# Patient Record
Sex: Male | Born: 1950 | Race: White | Hispanic: No | Marital: Married | Smoking: Never smoker
Health system: Southern US, Community
[De-identification: ages and names within clinical notes are randomized; demographics above are authoritative.]

## PROBLEM LIST (undated history)

## (undated) DIAGNOSIS — R011 Cardiac murmur, unspecified: Secondary | ICD-10-CM

## (undated) DIAGNOSIS — E119 Type 2 diabetes mellitus without complications: Secondary | ICD-10-CM

## (undated) DIAGNOSIS — I10 Essential (primary) hypertension: Secondary | ICD-10-CM

## (undated) HISTORY — PX: LAPAROSCOPIC GASTRIC BANDING: SHX1100

## (undated) HISTORY — DX: Type 2 diabetes mellitus without complications: E11.9

## (undated) HISTORY — PX: TONSILLECTOMY: SUR1361

## (undated) HISTORY — DX: Essential (primary) hypertension: I10

## (undated) HISTORY — DX: Cardiac murmur, unspecified: R01.1

---

## 2007-11-23 ENCOUNTER — Encounter: Admission: RE | Admit: 2007-11-23 | Discharge: 2007-11-23 | Payer: Self-pay | Admitting: Surgery

## 2007-11-23 ENCOUNTER — Ambulatory Visit (HOSPITAL_COMMUNITY): Admission: RE | Admit: 2007-11-23 | Discharge: 2007-11-23 | Payer: Self-pay | Admitting: Surgery

## 2007-12-12 ENCOUNTER — Ambulatory Visit (HOSPITAL_COMMUNITY): Admission: RE | Admit: 2007-12-12 | Discharge: 2007-12-12 | Payer: Self-pay | Admitting: Surgery

## 2008-02-01 ENCOUNTER — Ambulatory Visit: Payer: Self-pay | Admitting: Vascular Surgery

## 2008-04-09 ENCOUNTER — Encounter: Admission: RE | Admit: 2008-04-09 | Discharge: 2008-05-29 | Payer: Self-pay | Admitting: Surgery

## 2008-04-23 ENCOUNTER — Ambulatory Visit (HOSPITAL_COMMUNITY): Admission: RE | Admit: 2008-04-23 | Discharge: 2008-04-24 | Payer: Self-pay | Admitting: Surgery

## 2008-04-27 ENCOUNTER — Emergency Department (HOSPITAL_COMMUNITY): Admission: EM | Admit: 2008-04-27 | Discharge: 2008-04-27 | Payer: Self-pay | Admitting: Emergency Medicine

## 2008-07-17 ENCOUNTER — Ambulatory Visit: Payer: Self-pay | Admitting: Vascular Surgery

## 2008-08-15 ENCOUNTER — Encounter: Admission: RE | Admit: 2008-08-15 | Discharge: 2008-08-15 | Payer: Self-pay | Admitting: Vascular Surgery

## 2008-08-15 ENCOUNTER — Ambulatory Visit: Payer: Self-pay | Admitting: Vascular Surgery

## 2011-01-20 NOTE — Op Note (Signed)
Chris Walls, Chris Walls              ACCOUNT NO.:  [id number]   MEDICAL RECORD NO.:  [id number]          PATIENT TYPE:  OIB   LOCATION:  1535                         FACILITY:  [facility]   PHYSICIAN:  Thornton Park. Daphine Deutscher, MD  DATE OF BIRTH:  1951/07/29   DATE OF PROCEDURE:  04/23/2008  DATE OF DISCHARGE:                               OPERATIVE REPORT   PREOPERATIVE DIAGNOSES:  Morbid obesity with diabetes mellitus type 2,  body mass index 37 and hypertension.   POSTOPERATIVE DIAGNOSES:  Morbid obesity with diabetes mellitus type 2,  body mass index 37 and hypertension.   PROCEDURE:  Laparoscopic adjustable band, Allergan APL.   SURGEON:  Thornton Park. Daphine Deutscher, MD.   ASSISTANT:  Alfonse Ras, MD.   ANESTHESIA:  General endotracheal.   DESCRIPTION OF PROCEDURE:  Chris Walls was taken to room #1on Monday,  April 23, 2008, and given general anesthesia.  I clipped him around his  patches of psoriasis and then prepped his abdomen with Techni-Care and  draped him sterilely.  Access of the abdomen was gained through the left  upper quadrant using Optiview without difficulty.  The abdomen was  insufflated and the standard trocar placements were used, putting a 15  in the right upper position and a 12 down lower just above the  umbilicus.  He was very long waisted, it was quite a ways up to his EG  junction.  I went ahead and put a Satira Mccallum in through a 5 in the upper  midline.  Another 12 was placed to the left of the umbilicus for the  angle scope.   First, I went over and dissected the stomach on the left crus.  I then  went along to the right crus and dissected the area just above the fat  stripe.   Next, we passed a sizing tubing and blew up the balloon and pulled it  back.  It did not demonstrate a hiatal hernia.  Because of the angle and  trajectory, I did not feel like I would get a good angle from a lower  port, so I went ahead introduced an APL band into the abdomen.  I then  passed a band passer through the upper port on the right side.  This  came around through an open area that was readily seen and the band  passer presented itself easily.  The APL band was threaded through this  and brought around and engaged through the buckle.  It was held in  position in loose fashion until the sizing balloon could be passed in  the stomach and then it was buckled done over that.  There was plenty of  room.  The band had 5 mL of saline in it.  I went ahead and left that in  there as I plicated the stomach up proximally making a fairly small  pouch proximally.  Three sutures were placed and secured with tie knots.  No bleeding was encountered.  The tubing was then brought out through  the lower port on the right which was then extended and opened onto the  fascia.  It was connected to the port and then secured with four sutures  of 2-0 Prolene to the fascia.  The wound was irrigated and then closed  with 4-0 Vicryl subcutaneously with staples because of his  psoriasis.  The staples will be removed early.  All port sites were  injected with Marcaine, as well as were closed 4-0 Vicryls prior to  placement of the staples.  The patient tolerated the procedure well and  was taken to the recovery room in satisfactory condition.      Thornton Park Daphine Deutscher, MD  Electronically Signed     MBM/MEDQ  D:  04/23/2008  T:  04/23/2008  Job:  [id number]   cc:   Jacqualine Code, MD  Fax: [phone]

## 2011-01-20 NOTE — Consult Note (Signed)
NAMEMARKIES, MOWATT NO.:  [id number]   MEDICAL RECORD NO.:  [id number]          PATIENT TYPE:  EMS   LOCATION:  ED                           FACILITY:  [facility]   PHYSICIAN:  Thornton Park. Daphine Deutscher, MD  DATE OF BIRTH:  [birth year]   DATE OF CONSULTATION:  04/27/2008  DATE OF DISCHARGE:                                 CONSULTATION   .   CHIEF COMPLAINT:  Nausea with, of late, retching.   HISTORY:  Chris Walls is a 60 year old white male who is 4 days post  lap band APL at [facility].  He presents with a 16-hour history of  nausea and dry heaves.  He spoke with Dr. Derrell Lolling earlier in the evening,  who ordered some Phenergan suppositories.  I called him at home and his  pulse rate was in the mid 70s according to his wife, who checked it.  I  went ahead and asked him to come to the emergency room and I would see  him.  I saw him in room 4.  He was sitting up and looked somewhat  nauseated and uncomfortable.  Before his lab can be drawn, the x-ray  dept called for him and so met him up over there.  I went ahead and got  a lap band needle, and I removed 4.5 cc of fluid from his port.  Subsequently he had an upper GI by Dr. Verdene Lennert with first  Gastrografin and then light barium, which showed no leak or  extravasation.  There was no evidence of obstruction and contrast flowed  into the stomach.  He did have a fair amount of gas, which I think he  has been doing some air swallowing.   He feels much better.  His pulse rate after the procedure was 80.  His  lab has not been drawn and we will go ahead and get a CBC and a BMET to  check those.  If those are unremarkable, I think we will let him go home  on clear liquids and will follow it up as needed over the weekend.  He  is supposed to come back on Monday for staple removal since he has  psoriasis and I have closed his skin with staples.   IMPRESSION:  Status post lap band, upper gastrointestinal appears  unremarkable.  Awaiting laboratory work and final disposition.      Thornton Park Daphine Deutscher, MD  Electronically Signed     MBM/MEDQ  D:  04/27/2008  T:  04/27/2008  Job:  [id number]

## 2011-01-20 NOTE — Procedures (Signed)
DUPLEX ULTRASOUND OF ABDOMINAL AORTA   INDICATION:  Follow up abdominal aortic aneurysm.   HISTORY:  Diabetes:  No.  Cardiac:  Heart murmur.  Hypertension:  Yes.  Smoking:  No.  Connective Tissue Disorder:  Family History:  No.  Previous Surgery:  Laparoscopic banding done earlier this year but after  the last ultrasound done on 11/23/2007.   DUPLEX EXAM:         AP (cm)                   TRANSVERSE (cm)  Proximal             2.9 Cm                    2.8 cm  Mid                  Not visualized            Not visualized  Distal               2.1 cm                    1.9 cm  Right Iliac          1.2 cm                    1.3 cm  Left Iliac           1.2 cm                    1.3 cm   PREVIOUS:  Date: 11/23/2007  AP:  4.2  TRANSVERSE:  3.3 (Performed at  [facility]).   IMPRESSION:  1. No evidence of abdominal aortic aneurysm is noted, based on limited      visualization as a result from extensive bowel gas patterns.  2. The previous ultrasound performed at [facility] noted a      juxtarenal abdominal aortic aneurysm that could not be appreciated      during this examination.   ___________________________________________  Janetta Hora Fields, MD   CH/MEDQ  D:  07/17/2008  T:  07/17/2008  Job:  [id number]

## 2011-01-20 NOTE — Assessment & Plan Note (Signed)
OFFICE VISIT   Chris Walls, Chris Walls  DOB:  Apr 22, 1951                                       02/01/2008  TIRWE#:[id number]   The patient is a 60 year old male referred by Dr. Daphine Deutscher for evaluation  of abdominal aortic aneurysm.  He was recently seen by Dr. Daphine Deutscher for  consideration for laparoscopic banding procedure for obesity.  At the  time of workup for this, he had an ultrasound of the abdomen which  showed a 4.2 cm abdominal aortic aneurysm.  He has had no abdominal pain  or back pain.  He has no significant family history of aneurysm.  His  primary atherosclerotic risk factors include hypertension.  He also has  a history of diabetes.  He denies history of coronary artery disease or  elevated cholesterol.   PAST SURGICAL HISTORY:  Unremarkable.   Medications include metformin 1000 mg twice a day, Hyzaar 100/25 once a  day.  Glipizide 10 mg once a day, Actos 30 mg once a day, multivitamin.   He is allergic to penicillin which causes swelling and a rash.   SOCIAL HISTORY:  He is married and has two children.  Currently works as  a Administrator.  He is a nonsmoker non-consumer of alcohol.   REVIEW OF SYSTEMS:  He is 5 feet 8 inches, 245 pounds.  CARDIAC:  Has a history of murmur in the past.  Pulmonary, GI, renal vascular, neurologic, psychiatric, ENT, hematologic  review of systems are all negative.  SKIN:  He has had a rash which is currently being evaluated by  dermatologist and thought to be probably psoriasis.   PHYSICAL EXAM:  Blood pressure is 171/104 in the left arm, heart rate is  64 and regular.  HEENT is unremarkable.  Neck has 2+ carotid pulses  without bruit.  Chest:  Clear to auscultation.  Cardiac exam is regular  rate and rhythm.  Abdomen is obese, soft, nontender, nondistended with  no masses.  Extremities:  He has 2+ radial and femoral pulses  bilaterally.  He has a 3+ right popliteal pulse and a 2+ left popliteal  pulse.  He has 2+  dorsalis pedis and posterior tibial pulses  bilaterally.  Feet are warm, pink and well-perfused.  He has trace edema  in both lower extremities from the knee down.   Ultrasound dated November 23, 2007 from [facility] is reviewed.  This showed a 3 cm in diameter.  This also showed diffuse fatty  infiltration of the liver.   In light of his full-feeling popliteal pulse on the right side, we also  did a popliteal ultrasound on him today which showed normal popliteal  arteries bilaterally.   In summary, the patient has a 4.2 cm abdominal aortic aneurysm which is  currently asymptomatic.  I had a lengthy discussion with the patient  today about the pathophysiology of his abdominal aortic aneurysms.  I  informed him that the risk of rupture for aneurysm less than 5-1/2 cm in  diameter is less than 1% per year.  However, I also told him that  aneurysms of that size, eventually 75% of them become large enough that  they require repair.  I went over the symptoms of ruptured aneurysm,  which is back pain and abdominal pain, and told him that if he has  either of these, he should  go to the emergency room and let someone know  that he has an abdominal aortic aneurysm.  Otherwise, I believe the best  option for him would be continued ultrasound surveillance.  We will have  a repeat ultrasound of his aneurysm in six months' time.  I continue 6  months ultrasounds to assess the aneurysm for growth over time.  Do not  believe he has any contraindication to laparoscopic banding as far as  his aneurysm is concerned.   Chris Hora. Fields, MD  Electronically Signed   CEF/MEDQ  D:  02/01/2008  T:  02/02/2008  Job:  [id number]   cc:   Thornton Park Daphine Deutscher, MD  Jacqualine Code, MD

## 2011-01-20 NOTE — Procedures (Signed)
VASCULAR LAB EXAM   INDICATION:  Rule out popliteal artery aneurysm bilaterally.   HISTORY:  Diabetes:  Yes.  Cardiac:  No.  Hypertension:  Yes.   EXAM:  The patient has a known 4.2 cm abdominal aortic aneurysm and  prominent bilateral popliteal artery pulses.   IMPRESSION:  1. Right popliteal artery proximal 0.81 cm AP, 1.0 cm transverse.  Mid      0.63 cm AP, 0.7 cm transverse.  Distal 0.68 cm AP, 0.66 cm      transverse.  Left popliteal artery proximal 0.72 cm AP, 0.61 cm      transverse.  Mid 0.63 cm AP, 0.58 cm transverse.  Distal 0.64 cm      AP, 0.70 cm transverse.  2. No evidence of significant popliteal artery dilation bilaterally.   ___________________________________________  Janetta Hora Fields, MD   MC/MEDQ  D:  02/01/2008  T:  02/01/2008  Job:  [id number]

## 2011-06-05 LAB — BASIC METABOLIC PANEL
Calcium: 9.8
GFR calc Af Amer: >60
GFR calc non Af Amer: >60
Potassium: 3.7
Sodium: 140

## 2011-06-05 LAB — HEMOGLOBIN AND HEMATOCRIT, BLOOD
HCT: 46.2
Hemoglobin: 15.7

## 2012-12-14 ENCOUNTER — Telehealth (INDEPENDENT_AMBULATORY_CARE_PROVIDER_SITE_OTHER): Payer: Self-pay | Admitting: Surgery

## 2012-12-14 NOTE — Telephone Encounter (Signed)
12/14/12 lm and mailed recall letter for pt to call [phone] to schedule a bariatric follow-up appt with Dr. Daphine Deutscher. Pt has not been seen since his surgery-had lap band surgery 04/23/08.ls

## 2013-06-21 ENCOUNTER — Ambulatory Visit (INDEPENDENT_AMBULATORY_CARE_PROVIDER_SITE_OTHER): Payer: 59 | Admitting: Surgery

## 2013-06-21 ENCOUNTER — Encounter (INDEPENDENT_AMBULATORY_CARE_PROVIDER_SITE_OTHER): Payer: Self-pay | Admitting: Surgery

## 2013-06-21 VITALS — BP 138/76 | HR 60 | Resp 16 | Ht 69.0 in | Wt 177.8 lb

## 2013-06-21 DIAGNOSIS — I10 Essential (primary) hypertension: Secondary | ICD-10-CM | POA: Insufficient documentation

## 2013-06-21 DIAGNOSIS — E119 Type 2 diabetes mellitus without complications: Secondary | ICD-10-CM | POA: Insufficient documentation

## 2013-06-21 DIAGNOSIS — Z9884 Bariatric surgery status: Secondary | ICD-10-CM

## 2013-06-21 NOTE — Progress Notes (Signed)
Tonette Bihari 62 y.o.  Body mass index is 26.24 kg/(m^2).  There are no active problems to display for this patient.   Allergies  Allergen Reactions  . Penicillins Anaphylaxis    Past Surgical History  Procedure Laterality Date  . Laparoscopic gastric banding    . Tonsillectomy     CABEZA,YURI, MD No diagnosis found.  Mr. Nealy is 74 lbs below his preop weight 5 years ago.  She has kept it off.  He understands and is using this tool appropriately.  He appears to be in the green zone.  Discussed his strategies to maintain weight loss. Will see him back in 1 year.   Matt B. Daphine Deutscher, MD, [facility] [phone] beeper [phone]  06/21/2013 3:47 PM

## 2013-06-21 NOTE — Patient Instructions (Signed)
Thanks for your patience.  If you need further assistance after leaving the office, please call our office and speak with a [facility] nurse.  [phone].  If you want to leave a message for Dr. Geonna Lockyer, please call his office phone at [phone]. 

## 2017-11-09 ENCOUNTER — Encounter (HOSPITAL_COMMUNITY): Payer: Self-pay

## 2019-09-28 ENCOUNTER — Ambulatory Visit: Payer: Medicare HMO | Attending: Internal Medicine

## 2019-09-28 DIAGNOSIS — Z23 Encounter for immunization: Secondary | ICD-10-CM

## 2019-09-28 NOTE — Progress Notes (Signed)
   Covid-19 Vaccination Clinic  Name:  CANDON CARAS    MRN: [id number] DOB: [birth year]  09/28/2019  Mr. Befort was observed post Covid-19 immunization for 15 minutes without incidence. He was provided with Vaccine Information Sheet and instruction to access the V-Safe system.   Mr. Joost was instructed to call 911 with any severe reactions post vaccine: Marland Kitchen Difficulty breathing  . Swelling of your face and throat  . A fast heartbeat  . A bad rash all over your body  . Dizziness and weakness    Immunizations Administered    Name Date Dose VIS Date Route   Pfizer COVID-19 Vaccine 09/28/2019  1:26 PM 0.3 mL 08/18/2019 Intramuscular   Manufacturer: [employer], [employer]   Lot: [id number]   NDC: [address]-1

## 2019-10-17 ENCOUNTER — Ambulatory Visit: Payer: Medicare HMO | Attending: Internal Medicine

## 2019-10-17 DIAGNOSIS — Z23 Encounter for immunization: Secondary | ICD-10-CM | POA: Insufficient documentation

## 2019-10-17 NOTE — Progress Notes (Signed)
   Covid-19 Vaccination Clinic  Name:  Chris Walls    MRN: [id number] DOB: Nov 28, 1950  10/17/2019  Mr. Pellow was observed post Covid-19 immunization for 15 minutes without incidence. He was provided with Vaccine Information Sheet and instruction to access the V-Safe system.   Mr. Servellon was instructed to call 911 with any severe reactions post vaccine: Marland Kitchen Difficulty breathing  . Swelling of your face and throat  . A fast heartbeat  . A bad rash all over your body  . Dizziness and weakness    Immunizations Administered    Name Date Dose VIS Date Route   Pfizer COVID-19 Vaccine 10/17/2019  8:36 AM 0.3 mL 08/18/2019 Intramuscular   Manufacturer: [employer], [employer]   Lot: [id number]   NDC: [address]-3

## 2021-01-14 ENCOUNTER — Other Ambulatory Visit: Payer: Self-pay | Admitting: Student

## 2021-01-14 DIAGNOSIS — Z9884 Bariatric surgery status: Secondary | ICD-10-CM

## 2021-02-05 ENCOUNTER — Ambulatory Visit
Admission: RE | Admit: 2021-02-05 | Discharge: 2021-02-05 | Disposition: A | Discharge status: Home / Self Care | Payer: Medicare HMO | Source: Ambulatory Visit | Attending: Student | Admitting: Student

## 2021-02-05 ENCOUNTER — Other Ambulatory Visit: Payer: Self-pay | Admitting: Student

## 2021-02-05 DIAGNOSIS — Z9884 Bariatric surgery status: Secondary | ICD-10-CM

## 2021-02-11 ENCOUNTER — Observation Stay (HOSPITAL_BASED_OUTPATIENT_CLINIC_OR_DEPARTMENT_OTHER)
Admission: EM | Admit: 2021-02-11 | Discharge: 2021-02-12 | Disposition: A | Discharge status: Home / Self Care | Payer: Medicare HMO | Attending: Internal Medicine | Admitting: Internal Medicine

## 2021-02-11 ENCOUNTER — Other Ambulatory Visit: Payer: Self-pay

## 2021-02-11 ENCOUNTER — Emergency Department (HOSPITAL_BASED_OUTPATIENT_CLINIC_OR_DEPARTMENT_OTHER): Payer: Medicare HMO

## 2021-02-11 ENCOUNTER — Encounter (HOSPITAL_BASED_OUTPATIENT_CLINIC_OR_DEPARTMENT_OTHER): Payer: Self-pay

## 2021-02-11 DIAGNOSIS — Z20822 Contact with and (suspected) exposure to covid-19: Secondary | ICD-10-CM | POA: Insufficient documentation

## 2021-02-11 DIAGNOSIS — Z7984 Long term (current) use of oral hypoglycemic drugs: Secondary | ICD-10-CM | POA: Insufficient documentation

## 2021-02-11 DIAGNOSIS — T783XXA Angioneurotic edema, initial encounter: Principal | ICD-10-CM | POA: Diagnosis present

## 2021-02-11 DIAGNOSIS — I10 Essential (primary) hypertension: Secondary | ICD-10-CM | POA: Diagnosis not present

## 2021-02-11 DIAGNOSIS — Z79899 Other long term (current) drug therapy: Secondary | ICD-10-CM | POA: Insufficient documentation

## 2021-02-11 DIAGNOSIS — R1011 Right upper quadrant pain: Secondary | ICD-10-CM

## 2021-02-11 DIAGNOSIS — R1013 Epigastric pain: Secondary | ICD-10-CM | POA: Diagnosis present

## 2021-02-11 DIAGNOSIS — E119 Type 2 diabetes mellitus without complications: Secondary | ICD-10-CM | POA: Diagnosis not present

## 2021-02-11 LAB — COMPREHENSIVE METABOLIC PANEL
ALT: 18 U/L (ref 0–44)
AST: 22 U/L (ref 15–41)
Albumin: 3.8 g/dL (ref 3.5–5.0)
Alkaline Phosphatase: 61 U/L (ref 38–126)
Anion gap: 7 (ref 5–15)
BUN: 13 mg/dL (ref 8–23)
CO2: 28 mmol/L (ref 22–32)
Calcium: 8.9 mg/dL (ref 8.9–10.3)
Chloride: 99 mmol/L (ref 98–111)
Creatinine, Ser: 1.03 mg/dL (ref 0.61–1.24)
GFR, Estimated: >60 mL/min (ref >60)
Glucose, Bld: 166 mg/dL — ABNORMAL HIGH (ref 70–99)
Potassium: 3.8 mmol/L (ref 3.5–5.1)
Sodium: 134 mmol/L — ABNORMAL LOW (ref 135–145)
Total Bilirubin: 0.7 mg/dL (ref 0.3–1.2)
Total Protein: 7.5 g/dL (ref 6.5–8.1)

## 2021-02-11 LAB — CBC
HCT: 41.5 % (ref 39.0–52.0)
Hemoglobin: 14 g/dL (ref 13.0–17.0)
MCH: 28.8 pg (ref 26.0–34.0)
MCHC: 33.7 g/dL (ref 30.0–36.0)
MCV: 85.4 fL (ref 80.0–100.0)
Platelets: 323 10*3/uL (ref 150–400)
RBC: 4.86 MIL/uL (ref 4.22–5.81)
RDW: 13.5 % (ref 11.5–15.5)
WBC: 10.7 10*3/uL — ABNORMAL HIGH (ref 4.0–10.5)
nRBC: 0 % (ref 0.0–0.2)

## 2021-02-11 LAB — URINALYSIS, ROUTINE W REFLEX MICROSCOPIC
Bilirubin Urine: NEGATIVE
Glucose, UA: NEGATIVE mg/dL
Ketones, ur: NEGATIVE mg/dL
Leukocytes,Ua: NEGATIVE
Nitrite: NEGATIVE
Protein, ur: NEGATIVE mg/dL
Specific Gravity, Urine: 1.02 (ref 1.005–1.030)
pH: 7 (ref 5.0–8.0)

## 2021-02-11 LAB — GLUCOSE, CAPILLARY: Glucose-Capillary: 127 mg/dL — ABNORMAL HIGH (ref 70–99)

## 2021-02-11 LAB — LACTIC ACID, PLASMA: Lactic Acid, Venous: 1.2 mmol/L (ref 0.5–1.9)

## 2021-02-11 LAB — RESP PANEL BY RT-PCR (FLU A&B, COVID) ARPGX2
Influenza A by PCR: NEGATIVE
Influenza B by PCR: NEGATIVE
SARS Coronavirus 2 by RT PCR: NEGATIVE

## 2021-02-11 LAB — URINALYSIS, MICROSCOPIC (REFLEX)

## 2021-02-11 LAB — LIPASE, BLOOD: Lipase: 25 U/L (ref 11–51)

## 2021-02-11 MED ORDER — ACETAMINOPHEN 650 MG RE SUPP: 650.0000 mg | Freq: Four times a day (QID) | RECTAL | Status: DC | PRN

## 2021-02-11 MED ORDER — ACETAMINOPHEN 325 MG PO TABS: 650.0000 mg | ORAL_TABLET | Freq: Four times a day (QID) | ORAL | Status: DC | PRN

## 2021-02-11 MED ORDER — SODIUM CHLORIDE 0.9 % IV BOLUS
1000.0000 mL | Freq: Once | INTRAVENOUS | Status: AC
  Administered 2021-02-11: 1000 mL via INTRAVENOUS

## 2021-02-11 MED ORDER — IOHEXOL 350 MG/ML SOLN
100.0000 mL | Freq: Once | INTRAVENOUS | Status: AC | PRN
  Administered 2021-02-11: 75 mL via INTRAVENOUS

## 2021-02-11 MED ORDER — IOHEXOL 300 MG/ML  SOLN
100.0000 mL | Freq: Once | INTRAMUSCULAR | Status: AC | PRN
  Administered 2021-02-11: 75 mL via INTRAVENOUS

## 2021-02-11 MED ORDER — ONDANSETRON HCL 4 MG/2ML IJ SOLN: 4.0000 mg | Freq: Four times a day (QID) | INTRAMUSCULAR | Status: DC | PRN

## 2021-02-11 MED ORDER — ENOXAPARIN SODIUM 40 MG/0.4ML IJ SOSY
40.0000 mg | PREFILLED_SYRINGE | Freq: Every day | INTRAMUSCULAR | Status: DC
  Administered 2021-02-11: 40 mg via SUBCUTANEOUS
  Filled 2021-02-11: qty 0.4

## 2021-02-11 MED ORDER — ONDANSETRON HCL 4 MG/2ML IJ SOLN
4.0000 mg | Freq: Once | INTRAMUSCULAR | Status: AC
  Administered 2021-02-11: 4 mg via INTRAVENOUS
  Filled 2021-02-11: qty 2

## 2021-02-11 MED ORDER — ONDANSETRON HCL 4 MG PO TABS: 4.0000 mg | ORAL_TABLET | Freq: Four times a day (QID) | ORAL | Status: DC | PRN

## 2021-02-11 MED ORDER — MORPHINE SULFATE (PF) 4 MG/ML IV SOLN
4.0000 mg | Freq: Once | INTRAVENOUS | Status: AC
  Administered 2021-02-11: 4 mg via INTRAVENOUS
  Filled 2021-02-11: qty 1

## 2021-02-11 MED ORDER — INSULIN ASPART 100 UNIT/ML IJ SOLN
0.0000 [IU] | INTRAMUSCULAR | Status: DC
  Administered 2021-02-11 – 2021-02-12 (×2): 1 [IU] via SUBCUTANEOUS

## 2021-02-11 MED ORDER — CHLORHEXIDINE GLUCONATE CLOTH 2 % EX PADS
6.0000 | MEDICATED_PAD | Freq: Every day | CUTANEOUS | Status: DC
  Administered 2021-02-11: 6 via TOPICAL

## 2021-02-11 MED ORDER — PANTOPRAZOLE SODIUM 40 MG PO TBEC
40.0000 mg | DELAYED_RELEASE_TABLET | Freq: Every day | ORAL | Status: DC
  Administered 2021-02-12: 40 mg via ORAL
  Filled 2021-02-11: qty 1

## 2021-02-11 MED ORDER — SODIUM CHLORIDE 0.9 % IV SOLN: INTRAVENOUS | Status: AC

## 2021-02-11 MED ORDER — FENTANYL CITRATE (PF) 100 MCG/2ML IJ SOLN: 25.0000 ug | INTRAMUSCULAR | Status: DC | PRN

## 2021-02-11 NOTE — ED Notes (Signed)
Patient transported to CT 

## 2021-02-11 NOTE — Plan of Care (Signed)
[facility] -> [facility]  Patient: Chris Walls (DOB 25-Aug-2051)  EDP wanting to admit for angioedema of the intestines causing severe abdominal pain in a 70 year old who takes losartans and allegedly the increase that about a month ago.He came in and like maybe a week and a half ago he had he has a lap band evaluated that day he was having some sort of indigestion or felt like he was not swallowing super well so they did a barium swallow and decided they would like to take some saline out of the band to loosen a little bit. Subsequently  he felt much better after that until this morning at 4 AM he woke up with severe almost 10 out of 10 right-sided abdominal pain. Initial EDP thogught it was an internal hernia or some sort of ischemia. CT done and Radiologist felt as if it looked like a focal angioedema of the jejunum versus gastroenteritis. 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 Nadene Rubins sill felt as if was angioedema. EDP did call GI Dr. Ewing Schlein who recommended supportive care and observation. 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 EDP also contacted General surgery and spoke to Dr. Thresa Ross who does not feel that the symptoms are related to her lap band.  The recommendation from the general surgery team was to stop the losartan and probably just watch.  GI recommended that if things were to acutely worsen and if the patient has some sort of instability lhe would probably need to go to a tertiary care center but right now he is not decompensating so they felt that patient can be managed at [facility].  Patient is having pain but labs are okay and they are recommending bowel rest and symptomatic management.  Airway angioedema and cannot get him off of that and let them get better and bowel rest and symptomatic management for now.  Patient mildly had a leukocytosis on admission but his lactic acid was normal.  He is  accepted to a SDU Observation Bed.  If necessary GI and general surgery will be able to consult but for now recommend Supportive Care.

## 2021-02-11 NOTE — ED Triage Notes (Addendum)
Pt c/o epigastric pain, n/d, chills, dizziness started ~4am-NAD-steady gait to triage

## 2021-02-11 NOTE — ED Notes (Signed)
Provided completed EKG to MD Tegeler at approximately 1200. Triage RN aware EKG completed.

## 2021-02-11 NOTE — ED Notes (Signed)
ED Provider at bedside. 

## 2021-02-11 NOTE — H&P (Signed)
History and Physical    Chris Walls WCB:[id number] DOB: 1951/06/20 DOA: 02/11/2021  PCP: Andreas Blower., MD   Patient coming from: Home   Chief Complaint: Abdominal pain, nausea, loose stools   HPI: Chris Walls is a 70 y.o. male with medical history significant for hypertension, type 2 diabetes mellitus, and laparoscopic gastric banding now presenting to the emergency department for evaluation of abdominal pain, nausea, and loose stools.  Patient reports that he was in his usual state of health yesterday but woke at 4 AM and had severe abdominal pain in the center and right of his abdomen.  There was associated nausea but no vomiting.  He had several loose stools today.  Pain continued to worsen throughout the course of the day and he presented to the emergency department around noon.  He denies any associated fevers or chills.  [facility] ED Course: Upon arrival to the ED, patient is found to be afebrile, saturating well on room air, bradycardic in the mid 40s to mid 50s, and with stable blood pressure.  EKG features bradycardia with rate 51.  Chemistry panel unremarkable and CBC with slight leukocytosis.  Lactic acid was normal.  CT imaging in the emergency department demonstrates segmental enteritis involving the jejunum suspected to reflect angioedema.  ED physician discussed the case with gastroenterology and surgery and it was recommended the patient be admitted to the hospitalist service, losartan stopped, and general supportive measures provided.  The patient was given a liter of saline, morphine, and Zofran in the ED.  Review of Systems:  All other systems reviewed and apart from HPI, are negative.  Past Medical History:  Diagnosis Date  . Diabetes mellitus without complication (HCC)   . Heart murmur   . Hypertension     Past Surgical History:  Procedure Laterality Date  . LAPAROSCOPIC GASTRIC BANDING    . TONSILLECTOMY      Social History:   reports that he has never  smoked. He has never used smokeless tobacco. He reports current alcohol use. He reports that he does not use drugs.  Allergies  Allergen Reactions  . Penicillins Anaphylaxis  . Losartan Swelling    Family History  Problem Relation Age of Onset  . Heart disease Mother   . Heart disease Father      Prior to Admission medications   Medication Sig Start Date End Date Taking? Authorizing Provider  losartan-hydrochlorothiazide [facility]) 100-12.5 MG per tablet  06/18/13   [provider]    Physical Exam: Vitals:   02/11/21 1600 02/11/21 1906 02/11/21 2000 02/11/21 2200  BP: (!) 156/77 (!) 144/74 (!) 155/71 (!) 154/56  Pulse: (!) 44 (!) 47 (!) 43 (!) 44  Resp: 14 17 17  (!) 32  Temp:    98.3 F (36.8 C)  TempSrc:    Axillary  SpO2: 100% 99% 99% 96%  Weight:    91.8 kg  Height:    5\' 9"  (1.753 m)    Constitutional: NAD, calm  Eyes: PERTLA, lids and conjunctivae normal ENMT: Mucous membranes are moist. Posterior pharynx clear of any exudate or lesions.   Neck: supple, no masses  Respiratory: no wheezing, no crackles. No accessory muscle use.  Cardiovascular: S1 & S2 heard, regular rate and rhythm. No extremity edema.  Abdomen: No distension, soft, tender in mid-abdomen without rebound pain or guarding. Bowel sounds active.  Musculoskeletal: no clubbing / cyanosis. No joint deformity upper and lower extremities.   Skin: no significant rashes, lesions, ulcers. Warm,  dry, well-perfused. Neurologic: CN 2-12 grossly intact. Sensation intact. Moving all extremities.  Psychiatric: Alert and oriented to person, place, and situation. Very pleasant and cooperative.    Labs and Imaging on Admission: I have personally reviewed following labs and imaging studies  CBC: Recent Labs  Lab 02/11/21 1225  WBC 10.7*  HGB 14.0  HCT 41.5  MCV 85.4  PLT 323   Basic Metabolic Panel: Recent Labs  Lab 02/11/21 1225  NA 134*  K 3.8  CL 99  CO2 28  GLUCOSE 166*  BUN 13   CREATININE 1.03  CALCIUM 8.9   GFR: Estimated Creatinine Clearance: 74.7 mL/min (by C-G formula based on SCr of 1.03 mg/dL). Liver Function Tests: Recent Labs  Lab 02/11/21 1225  AST 22  ALT 18  ALKPHOS 61  BILITOT 0.7  PROT 7.5  ALBUMIN 3.8   Recent Labs  Lab 02/11/21 1225  LIPASE 25   No results for input(s): AMMONIA in the last 168 hours. Coagulation Profile: No results for input(s): INR, PROTIME in the last 168 hours. Cardiac Enzymes: No results for input(s): CKTOTAL, CKMB, CKMBINDEX, TROPONINI in the last 168 hours. BNP (last 3 results) No results for input(s): PROBNP in the last 8760 hours. HbA1C: No results for input(s): HGBA1C in the last 72 hours. CBG: No results for input(s): GLUCAP in the last 168 hours. Lipid Profile: No results for input(s): CHOL, HDL, LDLCALC, TRIG, CHOLHDL, LDLDIRECT in the last 72 hours. Thyroid Function Tests: No results for input(s): TSH, T4TOTAL, FREET4, T3FREE, THYROIDAB in the last 72 hours. Anemia Panel: No results for input(s): VITAMINB12, FOLATE, FERRITIN, TIBC, IRON, RETICCTPCT in the last 72 hours. Urine analysis:    Component Value Date/Time   COLORURINE YELLOW 02/11/2021 1225   APPEARANCEUR CLEAR 02/11/2021 1225   LABSPEC 1.020 02/11/2021 1225   PHURINE 7.0 02/11/2021 1225   GLUCOSEU NEGATIVE 02/11/2021 1225   HGBUR TRACE (A) 02/11/2021 1225   BILIRUBINUR NEGATIVE 02/11/2021 1225   KETONESUR NEGATIVE 02/11/2021 1225   PROTEINUR NEGATIVE 02/11/2021 1225   NITRITE NEGATIVE 02/11/2021 1225   LEUKOCYTESUR NEGATIVE 02/11/2021 1225   Sepsis Labs: @LABRCNTIP (procalcitonin:4,lacticidven:4) ) Recent Results (from the past 240 hour(s))  Resp Panel by RT-PCR (Flu A&B, Covid) Nasopharyngeal Swab     Status: None   Collection Time: 02/11/21  4:45 PM   Specimen: Nasopharyngeal Swab; Nasopharyngeal(NP) swabs in vial transport medium  Result Value Ref Range Status   SARS Coronavirus 2 by RT PCR NEGATIVE NEGATIVE Final     Comment: (NOTE) SARS-CoV-2 target nucleic acids are NOT DETECTED.  The SARS-CoV-2 RNA is generally detectable in upper respiratory specimens during the acute phase of infection. The lowest concentration of SARS-CoV-2 viral copies this assay can detect is 138 copies/mL. A negative result does not preclude SARS-Cov-2 infection and should not be used as the sole basis for treatment or other patient management decisions. A negative result may occur with  improper specimen collection/handling, submission of specimen other than nasopharyngeal swab, presence of viral mutation(s) within the areas targeted by this assay, and inadequate number of viral copies(<138 copies/mL). A negative result must be combined with clinical observations, patient history, and epidemiological information. The expected result is Negative.  Fact Sheet for Patients:  04/13/21  Fact Sheet for Healthcare Providers:  BloggerCourse.com  This test is no t yet approved or cleared by the [place] FDA and  has been authorized for detection and/or diagnosis of SARS-CoV-2 by FDA under an Emergency Use Authorization (EUA). This EUA will remain  in effect (meaning this test can be used) for the duration of the COVID-19 declaration under Section 564(b)(1) of the Act, 21 U.S.C.section 360bbb-3(b)(1), unless the authorization is terminated  or revoked sooner.       Influenza A by PCR NEGATIVE NEGATIVE Final   Influenza B by PCR NEGATIVE NEGATIVE Final    Comment: (NOTE) The Xpert Xpress SARS-CoV-2/FLU/RSV plus assay is intended as an aid in the diagnosis of influenza from Nasopharyngeal swab specimens and should not be used as a sole basis for treatment. Nasal washings and aspirates are unacceptable for Xpert Xpress SARS-CoV-2/FLU/RSV testing.  Fact Sheet for Patients: BloggerCourse.comhttps://www.fda.gov/media/152166/download  Fact Sheet for Healthcare  Providers: SeriousBroker.ithttps://www.fda.gov/media/152162/download  This test is not yet approved or cleared by the [place] FDA and has been authorized for detection and/or diagnosis of SARS-CoV-2 by FDA under an Emergency Use Authorization (EUA). This EUA will remain in effect (meaning this test can be used) for the duration of the COVID-19 declaration under Section 564(b)(1) of the Act, 21 U.S.C. section 360bbb-3(b)(1), unless the authorization is terminated or revoked.  Performed at [facility] [place], [address] [place], [place] [id number]      Radiological Exams on Admission: CT Angio Abdomen W and/or Wo Contrast  Result Date: 02/11/2021 CLINICAL DATA:  Suspected vascular disease involving the SMA in a patient with enteritis. EXAM: CT ANGIOGRAPHY ABDOMEN TECHNIQUE: Multidetector CT imaging of the abdomen was performed using the standard protocol during bolus administration of intravenous contrast. Multiplanar reconstructed images and MIPs were obtained and reviewed to evaluate the vascular anatomy. CONTRAST:  75mL OMNIPAQUE IOHEXOL 350 MG/ML SOLN COMPARISON:  Exam from earlier on February 11, 2021. FINDINGS: VASCULAR Aorta: The aorta is of normal caliber with calcified atheromatous plaque. No signs of dissection or vasculitis. Celiac: Celiac with mild disease at the origin. Widely patent with classic celiac anatomy. No signs of aneurysmal dilation of the celiac or its branches. SMA: SMA with narrowing approximately 4 cm from the origin due to atherosclerotic plaque, mild to moderate narrowing. Vessel beyond this plaque is well opacified to peripheral branches in the abdomen. No adjacent stranding or dilation. Plaque is eccentric in the lumen along the LEFT lateral aspect of the lumen. Renals: 2 RIGHT renal arteries. Two LEFT renal arteries. Vessels are patent without signs of dilation, beading or stenosis. IMA: Patent without evidence of aneurysm, dissection, vasculitis or significant stenosis.  Inflow: Patent without evidence of aneurysm, dissection, vasculitis or significant stenosis. Veins: Veins better assessed on earlier venous phase evaluation, normal caliber and smooth contour as before. Review of the MIP images confirms the above findings. NON-VASCULAR Lower chest: Unremarkable Hepatobiliary: Normal appearance of the liver. No pericholecystic stranding. Pancreas: Normal, without mass, inflammation or ductal dilatation. Spleen: Normal spleen. Adrenals/Urinary Tract: Adrenal glands are normal. Symmetric renal enhancement without hydronephrosis. Renal sinus cysts bilaterally and small LEFT renal cyst as before. Stomach/Bowel: Lap band in place as before. Signs of segmental enteritis of the jejunum. Bowel is incompletely imaged. Findings are similar with respect to visualized portions of the bowel compared to the recent evaluation. Lymphatic: There is no gastrohepatic or hepatoduodenal ligament lymphadenopathy. No retroperitoneal or mesenteric lymphadenopathy. Other: No free air.  No portal venous gas.  No ascites. Musculoskeletal: No acute bone finding or destructive bone process. IMPRESSION: 1. 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 Vessel is well opacified to peripheral branches in the  abdomen. No adjacent stranding or dilation. 2. Aortic atherosclerosis. 3. Signs of segmental enteritis of the jejunum. Bowel is incompletely imaged. Findings are similar with respect to visualized portions of the bowel compared to the recent evaluation. Again would suggest lactate correlation and consider the possibility of angioedema. 4. Lap band in place as before. 5. Renal sinus cysts bilaterally and small LEFT renal cyst as before. Aortic Atherosclerosis (ICD10-I70.0). Electronically Signed   By: Donzetta Kohut M.D.   On: 02/11/2021 15:42   CT ABDOMEN PELVIS W  CONTRAST  Result Date: 02/11/2021 CLINICAL DATA:  Abdominal pain, post lap band. EXAM: CT ABDOMEN AND PELVIS WITH CONTRAST TECHNIQUE: Multidetector CT imaging of the abdomen and pelvis was performed using the standard protocol following bolus administration of intravenous contrast. CONTRAST:  <See Chart> OMNIPAQUE IOHEXOL 300 MG/ML  SOLN COMPARISON:  May 21, 2016.  Recent upper GI. FINDINGS: Lower chest: Incidental imaging of the lung bases is unremarkable. Distal esophagus mildly thickened as on previous imaging. Hepatobiliary: Liver with normal appearance. The no pericholecystic stranding or sign of biliary duct dilation. Pancreas: Normal, without mass, inflammation or ductal dilatation. Spleen: Spleen normal size and contour. Adrenals/Urinary Tract: Adrenal glands are normal. Symmetric renal enhancement. No hydronephrosis. Renal sinus cysts on the LEFT. Small cortical cyst at the interpolar aspect of the LEFT kidney. Renal sinus cyst also present on the RIGHT. No hydronephrosis. Normal under distended appearance of the urinary bladder. Stomach/Bowel: Post lap band as on prior studies. Angle of the lap band is unchanged compared to the previous evaluation angle at approximately 43 degrees. Moderately distended stomach. Segmental bowel wall thickening involving the jejunum with mild mesenteric inflammation/edema with gradual transition as jejunum passes into the ileum. No sign of pneumatosis. Fluid-filled loops of bowel with ingested contents in the lumen. Colon without signs of adjacent stranding. Scattered colonic diverticulosis. Vascular/Lymphatic: Aortic atherosclerosis. Atherosclerosis also affecting visceral branches. Approximately 4 cm beyond the origin of the SMA is calcified plaque which also appears to be associated with soft plaque with what appears to be at least moderate narrowing of the vessel which cannot be well quantified given motion related artifact. Vessel beyond this is grossly patent on  venous phase. No aneurysmal dilation of the abdominal aorta. SMV is patent. Portal vein is patent. There is no gastrohepatic or hepatoduodenal ligament lymphadenopathy. No retroperitoneal or mesenteric lymphadenopathy. No pelvic sidewall lymphadenopathy. Reproductive: Fiducial markers about the prostate, radiopaque metallic markers potentially related to prior prostate radiation though of uncertain significance, should be correlated with patient history. Other: Fat containing bilateral inguinal hernias. No free air. No ascites. Musculoskeletal: No acute bone finding or destructive bone process. IMPRESSION: 1. Findings of segmental enteritis involving the jejunum. Nonspecific but given angiotensin receptor blocker therapy with losartan would consider the possibility of angioedema of the small bowel. Correlate with intermittent abdominal pain, would also correlate with lactate to exclude the possibility of ischemia given atherosclerotic changes, see below. 2. SMA narrowing described above difficult to quantify given some motion and venous phase assessment. In the setting of enteritis CT angiography may be helpful for further assessment. 3. Lap band without complicating features. Distal esophageal thickening likely esophagitis. 4. Fiducial markers about the prostate gland. Correlate with any history of prostate neoplasm and prior radiation. 5. Fat containing bilateral inguinal hernias. 6. Aortic atherosclerosis. These results were called by telephone at the time of interpretation on 02/11/2021 at 1426 pm to provider [facility] , who verbally acknowledged these results. Electronically Signed   By: Donzetta Kohut  M.D.   On: 02/11/2021 14:26    EKG: Independently reviewed. Sinus bradycardia, rate 51.   Assessment/Plan   1. Abdominal pain   - Presents with acute-onset abdominal pain, nausea, and loose stools and has imaging findings suggestive of angioedema involving segment of jejunum, likely related to ARB   - Stop losartan, continue close monitoring and supportive care    2. Hypertension  - Stop losartan as above, treat as-needed only for now    3. Type II DM  - A1c was 6.2% in April 2022  - Received iodinated contrast in ED, hold metformin and use low-intensity SSI if needed for now     DVT prophylaxis: Lovenox  Code Status: Full  Level of Care: Level of care: Stepdown Family Communication: None present  Disposition Plan:  Patient is from: home  Anticipated d/c is to: Home  Anticipated d/c date is: 6/8 or 02/13/21  Patient currently: Pending continued improvement  Consults called: None  Admission status: observation     Briscoe Deutscher, MD [facility]  02/11/2021, 10:23 PM

## 2021-02-11 NOTE — ED Provider Notes (Signed)
[facility] EMERGENCY DEPARTMENT Provider Note   CSN: [id number] Arrival date & time: 02/11/21  1143     History Chief Complaint  Patient presents with  . Abdominal Pain    Chris Walls is a 70 y.o. male.  Patient reports that he woke up this morning at 4 AM with severe epigastric pain.  He reports that he has been having normal bowel movements and his last bowel movement was at 10 AM today.  He says that he has had a kidney stone in the past but this does not feel like a kidney stone.  Of note he does report that he has history of a Lap-Band which was adjusted and he had an upper endoscopy last week.  Reports nausea but no vomiting.  Denies any diarrhea.  Denies any chest pain, shortness of breath.  Denies any changes in urination.  Denies any changes in diet.  Of note, was discussing patient's antihypertensive medication regimen and he reports that approximately 1 month ago they did increase the dose of his antihypertensive medications but he is not sure how much they increased it.       Past Medical History:  Diagnosis Date  . Diabetes mellitus without complication (HCC)   . Heart murmur   . Hypertension     Patient Active Problem List   Diagnosis Date Noted  . 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 Diabetes (HCC) 06/21/2013  . Essential hypertension, benign 06/21/2013    Past Surgical History:  Procedure Laterality Date  . LAPAROSCOPIC GASTRIC BANDING    . TONSILLECTOMY         Family History  Problem Relation Age of Onset  . Heart disease Mother   . Heart disease Father     Social History   Tobacco Use  . Smoking status: Never Smoker  . Smokeless tobacco: Never Used  Substance Use Topics  . Alcohol use: Yes    Comment: occ  . Drug use: No    Home Medications Prior to Admission medications   Medication Sig Start Date End Date Taking? Authorizing Provider  losartan-hydrochlorothiazide [facility]) 100-12.5 MG per tablet  06/18/13    [provider]  metFORMIN (GLUCOPHAGE) 500 MG tablet Take 500 mg by mouth 2 (two) times daily with a meal.    [provider]    Allergies    Penicillins  Review of Systems   Review of Systems  Constitutional: Positive for chills. Negative for fever.  HENT: Negative for congestion and rhinorrhea.   Respiratory: Negative for shortness of breath.   Cardiovascular: Negative for chest pain.  Gastrointestinal: Positive for abdominal pain. Negative for constipation, diarrhea, nausea and vomiting.    Physical Exam Updated Vital Signs BP (!) 182/82 (BP Location: Right Arm)   Pulse (!) 47   Temp 98 F (36.7 C) (Oral)   Resp 18   Ht 5\' 9"  (1.753 m)   Wt 92.1 kg   SpO2 96%   BMI 29.98 kg/m   Physical Exam Vitals reviewed.  Constitutional:      General: He is in acute distress.     Appearance: He is not diaphoretic.  HENT:     Head: Normocephalic and atraumatic.     Mouth/Throat:     Mouth: Mucous membranes are moist.     Pharynx: Oropharynx is clear.  Eyes:     Extraocular Movements: Extraocular movements intact.  Cardiovascular:     Rate and Rhythm: Normal rate and regular rhythm.  Heart sounds: Normal heart sounds.  Pulmonary:     Effort: Pulmonary effort is normal. No respiratory distress.     Breath sounds: Normal breath sounds.  Abdominal:     General: A surgical scar is present. Bowel sounds are normal. There is distension.     Palpations: There is mass (Patient reports Lap-Band palpable cuff).     Tenderness: There is abdominal tenderness in the right upper quadrant, epigastric area and periumbilical area.     Hernia: A hernia is present.  Skin:    General: Skin is warm and dry.     Capillary Refill: Capillary refill takes less than 2 seconds.  Neurological:     General: No focal deficit present.     Mental Status: He is alert.  Psychiatric:        Mood and Affect: Mood normal.     ED Results / Procedures / Treatments   Labs (all labs  ordered are listed, but only abnormal results are displayed) Labs Reviewed  COMPREHENSIVE METABOLIC PANEL - Abnormal; Notable for the following components:      Result Value   Sodium 134 (*)    Glucose, Bld 166 (*)    All other components within normal limits  CBC - Abnormal; Notable for the following components:   WBC 10.7 (*)    All other components within normal limits  URINALYSIS, ROUTINE W REFLEX MICROSCOPIC - Abnormal; Notable for the following components:   Hgb urine dipstick TRACE (*)    All other components within normal limits  URINALYSIS, MICROSCOPIC (REFLEX) - Abnormal; Notable for the following components:   Bacteria, UA RARE (*)    All other components within normal limits  LIPASE, BLOOD  LACTIC ACID, PLASMA  LACTIC ACID, PLASMA    EKG EKG Interpretation  Date/Time:  Tuesday February 11 2021 11:57:03 EDT Ventricular Rate:  51 PR Interval:  130 QRS Duration: 106 QT Interval:  470 QTC Calculation: 433 R Axis:   32 Text Interpretation: Unusual P axis, possible ectopic atrial bradycardia with Premature atrial complexes 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 PT confirms WPW. NO STEMI Confirmed by Theda Belfast ([id number]) on 02/11/2021 1:26:15 PM   Radiology CT ABDOMEN PELVIS W CONTRAST  Result Date: 02/11/2021 CLINICAL DATA:  Abdominal pain, post lap band. EXAM: CT ABDOMEN AND PELVIS WITH CONTRAST TECHNIQUE: Multidetector CT imaging of the abdomen and pelvis was performed using the standard protocol following bolus administration of intravenous contrast. CONTRAST:  <See Chart> OMNIPAQUE IOHEXOL 300 MG/ML  SOLN COMPARISON:  May 21, 2016.  Recent upper GI. FINDINGS: Lower chest: Incidental imaging of the lung bases is unremarkable. Distal esophagus mildly thickened as on previous imaging. Hepatobiliary: Liver with normal appearance. The no pericholecystic stranding or sign of biliary duct dilation. Pancreas: Normal, without mass, inflammation or ductal  dilatation. Spleen: Spleen normal size and contour. Adrenals/Urinary Tract: Adrenal glands are normal. Symmetric renal enhancement. No hydronephrosis. Renal sinus cysts on the LEFT. Small cortical cyst at the interpolar aspect of the LEFT kidney. Renal sinus cyst also present on the RIGHT. No hydronephrosis. Normal under distended appearance of the urinary bladder. Stomach/Bowel: Post lap band as on prior studies. Angle of the lap band is unchanged compared to the previous evaluation angle at approximately 43 degrees. Moderately distended stomach. Segmental bowel wall thickening involving the jejunum with mild mesenteric inflammation/edema with gradual transition as jejunum passes into the ileum. No sign of pneumatosis. Fluid-filled loops of bowel with ingested contents in the lumen. Colon  without signs of adjacent stranding. Scattered colonic diverticulosis. Vascular/Lymphatic: Aortic atherosclerosis. Atherosclerosis also affecting visceral branches. Approximately 4 cm beyond the origin of the SMA is calcified plaque which also appears to be associated with soft plaque with what appears to be at least moderate narrowing of the vessel which cannot be well quantified given motion related artifact. Vessel beyond this is grossly patent on venous phase. No aneurysmal dilation of the abdominal aorta. SMV is patent. Portal vein is patent. There is no gastrohepatic or hepatoduodenal ligament lymphadenopathy. No retroperitoneal or mesenteric lymphadenopathy. No pelvic sidewall lymphadenopathy. Reproductive: Fiducial markers about the prostate, radiopaque metallic markers potentially related to prior prostate radiation though of uncertain significance, should be correlated with patient history. Other: Fat containing bilateral inguinal hernias. No free air. No ascites. Musculoskeletal: No acute bone finding or destructive bone process. IMPRESSION: 1. Findings of segmental enteritis involving the jejunum. Nonspecific but given  angiotensin receptor blocker therapy with losartan would consider the possibility of angioedema of the small bowel. Correlate with intermittent abdominal pain, would also correlate with lactate to exclude the possibility of ischemia given atherosclerotic changes, see below. 2. SMA narrowing described above difficult to quantify given some motion and venous phase assessment. In the setting of enteritis CT angiography may be helpful for further assessment. 3. Lap band without complicating features. Distal esophageal thickening likely esophagitis. 4. Fiducial markers about the prostate gland. Correlate with any history of prostate neoplasm and prior radiation. 5. Fat containing bilateral inguinal hernias. 6. Aortic atherosclerosis. These results were called by telephone at the time of interpretation on 02/11/2021 at 1426 pm to provider [facility] , who verbally acknowledged these results. Electronically Signed   By: Donzetta Kohut M.D.   On: 02/11/2021 14:26    Procedures Procedures   Medications Ordered in ED Medications  iohexol (OMNIPAQUE) 300 MG/ML solution 100 mL (75 mLs Intravenous Contrast Given 02/11/21 1329)  morphine 4 MG/ML injection 4 mg (4 mg Intravenous Given 02/11/21 1452)  sodium chloride 0.9 % bolus 1,000 mL (1,000 mLs Intravenous New Bag/Given 02/11/21 1502)  ondansetron (ZOFRAN) injection 4 mg (4 mg Intravenous Given 02/11/21 1500)  iohexol (OMNIPAQUE) 350 MG/ML injection 100 mL (75 mLs Intravenous Contrast Given 02/11/21 1510)    ED Course  I have reviewed the triage vital signs and the nursing notes.  Pertinent labs & imaging results that were available during my care of the patient were reviewed by me and considered in my medical decision making (see chart for details).    MDM Rules/Calculators/A&P                          69 year old male presents to the emergency department for abdominal pain.  He recently had manipulation of lap band approximately 2 weeks ago but woke up  this morning at approximately 4 AM with severe epigastric pain.  On arrival to the emergency department patient was hemodynamically stable.  CMP showed elevated glucose but was otherwise normal.  CBC showed mildly elevated WBC at 10.7.  Urinalysis showed trace hemoglobin but was other than was normal.  CT abdomen and pelvis was ordered and showed segmental enteritis involving the jejunum.  Nonspecific given angiotensin receptor blocker therapy with losartan would consider the possibility of angioedema of the small bowel.  It also showed SMA narrowing and recommended CT angiography.  General surgery was consulted and recommended admission for further monitoring.  Out of concern radiology reached out and requested we order further imaging with  CT angio abdomen pelvis.  We also ordered a lactic acid.  Lactic acid was 1.2.  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  The vessels well-opacified to peripheral branches in the abdomen.  No adjacent stranding or dilation.  Signs of segmental enteritis in the jejunum but the bowel is incompletely imaged.  Discussed case with gastroenterology who had no further recommendations other than that if the patient begins to decompensate may need transfer to tertiary care facility for further assessment and stabilization.  Medicine was called for the admission and the case was signed out to the oncoming provider.  Final Clinical Impression(s) / ED Diagnoses Final diagnoses:  Right upper quadrant abdominal pain    Rx / DC Orders ED Discharge Orders    None       Derrel Nipresenzo, Victor, MD 02/11/21 1554    Tegeler, Canary Brimhristopher J, MD 02/11/21 2034

## 2021-02-12 ENCOUNTER — Observation Stay (HOSPITAL_BASED_OUTPATIENT_CLINIC_OR_DEPARTMENT_OTHER): Payer: Medicare HMO

## 2021-02-12 DIAGNOSIS — T783XXA Angioneurotic edema, initial encounter: Secondary | ICD-10-CM | POA: Diagnosis not present

## 2021-02-12 DIAGNOSIS — R9431 Abnormal electrocardiogram [ECG] [EKG]: Secondary | ICD-10-CM | POA: Diagnosis not present

## 2021-02-12 LAB — ECHOCARDIOGRAM COMPLETE
AR max vel: 2.53 cm2
AV Area VTI: 2.5 cm2
AV Area mean vel: 2.36 cm2
AV Mean grad: 5 mmHg
AV Peak grad: 10 mmHg
Ao pk vel: 1.58 m/s
Area-P 1/2: 3.91 cm2
Height: 69 in
S' Lateral: 3.2 cm
Weight: 3238.12 oz

## 2021-02-12 LAB — COMPREHENSIVE METABOLIC PANEL
ALT: 14 U/L (ref 0–44)
AST: 15 U/L (ref 15–41)
Albumin: 3 g/dL — ABNORMAL LOW (ref 3.5–5.0)
Alkaline Phosphatase: 52 U/L (ref 38–126)
Anion gap: 5 (ref 5–15)
BUN: 11 mg/dL (ref 8–23)
CO2: 29 mmol/L (ref 22–32)
Calcium: 8.5 mg/dL — ABNORMAL LOW (ref 8.9–10.3)
Chloride: 105 mmol/L (ref 98–111)
Creatinine, Ser: 1.11 mg/dL (ref 0.61–1.24)
GFR, Estimated: >60 mL/min (ref >60)
Glucose, Bld: 127 mg/dL — ABNORMAL HIGH (ref 70–99)
Potassium: 3.9 mmol/L (ref 3.5–5.1)
Sodium: 139 mmol/L (ref 135–145)
Total Bilirubin: 0.9 mg/dL (ref 0.3–1.2)
Total Protein: 5.9 g/dL — ABNORMAL LOW (ref 6.5–8.1)

## 2021-02-12 LAB — HIV ANTIBODY (ROUTINE TESTING W REFLEX): HIV Screen 4th Generation wRfx: NONREACTIVE

## 2021-02-12 LAB — GLUCOSE, CAPILLARY
Glucose-Capillary: 118 mg/dL — ABNORMAL HIGH (ref 70–99)
Glucose-Capillary: 130 mg/dL — ABNORMAL HIGH (ref 70–99)
Glucose-Capillary: 107 mg/dL — ABNORMAL HIGH (ref 70–99)

## 2021-02-12 LAB — CBC
HCT: 37.3 % — ABNORMAL LOW (ref 39.0–52.0)
Hemoglobin: 12.1 g/dL — ABNORMAL LOW (ref 13.0–17.0)
MCH: 28.8 pg (ref 26.0–34.0)
MCHC: 32.4 g/dL (ref 30.0–36.0)
MCV: 88.8 fL (ref 80.0–100.0)
Platelets: 276 10*3/uL (ref 150–400)
RBC: 4.2 MIL/uL — ABNORMAL LOW (ref 4.22–5.81)
RDW: 13.8 % (ref 11.5–15.5)
WBC: 8 10*3/uL (ref 4.0–10.5)
nRBC: 0 % (ref 0.0–0.2)

## 2021-02-12 LAB — MRSA PCR SCREENING: MRSA by PCR: NEGATIVE

## 2021-02-12 MED ORDER — ORAL CARE MOUTH RINSE
15.0000 mL | Freq: Two times a day (BID) | OROMUCOSAL | Status: DC
  Administered 2021-02-12: 15 mL via OROMUCOSAL

## 2021-02-12 MED ORDER — HYDROCHLOROTHIAZIDE 12.5 MG PO CAPS: 12.5000 mg | ORAL_CAPSULE | Freq: Every day | ORAL | 1 refills | Status: AC

## 2021-02-12 MED ORDER — CHLORHEXIDINE GLUCONATE CLOTH 2 % EX PADS: 6.0000 | MEDICATED_PAD | Freq: Every day | CUTANEOUS | Status: DC

## 2021-02-12 MED ORDER — AMLODIPINE BESYLATE 10 MG PO TABS: 10.0000 mg | ORAL_TABLET | Freq: Every day | ORAL | 1 refills | Status: AC

## 2021-02-12 MED ORDER — AMLODIPINE BESYLATE 10 MG PO TABS
10.0000 mg | ORAL_TABLET | Freq: Every day | ORAL | Status: DC
  Administered 2021-02-12: 10 mg via ORAL
  Filled 2021-02-12: qty 1

## 2021-02-12 NOTE — Discharge Summary (Signed)
Physician Discharge Summary  Chris Walls ZOX:[id number] DOB: 1951/02/13 DOA: 02/11/2021  PCP: Andreas Blower., MD  Admit date: 02/11/2021 Discharge date: 02/12/2021  Admitted From: Home Disposition:  Home  Discharge Condition:Stable CODE STATUS:FULL Diet recommendation: Heart Healthy  Brief/Interim Summary:  HPI: Chris Walls is a 70 y.o. male with medical history significant for hypertension, type 2 diabetes mellitus, and laparoscopic gastric banding now presenting to the emergency department for evaluation of abdominal pain, nausea, and loose stools.  Patient reports that he was in his usual state of health yesterday but woke at 4 AM and had severe abdominal pain in the center and right of his abdomen.  There was associated nausea but no vomiting.  He had several loose stools today.  Pain continued to worsen throughout the course of the day and he presented to the emergency department around noon.  He denies any associated fevers or chills.  [facility] ED Course: Upon arrival to the ED, patient is found to be afebrile, saturating well on room air, bradycardic in the mid 40s to mid 50s, and with stable blood pressure.  EKG features bradycardia with rate 51.  Chemistry panel unremarkable and CBC with slight leukocytosis.  Lactic acid was normal.  CT imaging in the emergency department demonstrates segmental enteritis involving the jejunum suspected to reflect angioedema.  ED physician discussed the case with gastroenterology and surgery and it was recommended the patient be admitted to the hospitalist service, losartan stopped, and general supportive measures provided.  The patient was given a liter of saline, morphine, and Zofran in the ED.  Hospital course  His hospital course remained stable.  His abdomen pain, nausea, loose stools were most likely associated with bowel angioedema secondary to losartan.  This morning he does not complain of any abdominal pain, nausea, vomiting or diarrhea.  He  feels very comfortable.  He tolerated solid diet.  We stopped the losartan on discharge and replaced it with amlodipine.   he will also continue hydrochlorothiazide at home.  I have requested him to follow-up closely with his primary care physician for the management of his diabetes.   He was noticed to have sinus bradycardia.  As per the patient, this is chronic, he is fully asymptomatic.  He follows with cardiology as an outpatient.  Echocardiogram showed EF of 60-65%, mild left ventricular hypertrophy, normal left ventricular diastolic parameters, no valvular abnormalities. Patient will continue his home regimen for diabetes type 2.  He is medically stable for discharge to home today.  Discharge Diagnoses:  Principal Problem:   Angioedema Active Problems:   Diabetes (HCC)   Essential hypertension, benign    Discharge Instructions  Discharge Instructions    Diet - low sodium heart healthy   Complete by: As directed    Discharge instructions   Complete by: As directed    1)Please follow up with your PCP in a week. 2)we have changed  your  blood pressure medications.  You will need to monitor blood pressure  at home.  Follow-up with your primary care physician for  management of your HTN.Do not take losartan in the future   Increase activity slowly   Complete by: As directed      Allergies as of 02/12/2021      Reactions   Penicillins Anaphylaxis   Losartan Swelling      Medication List    STOP taking these medications   losartan-hydrochlorothiazide 100-12.5 MG tablet Commonly known as: HYZAAR     TAKE these  medications   amLODipine 10 MG tablet Commonly known as: NORVASC Take 1 tablet (10 mg total) by mouth daily.   hydrochlorothiazide 12.5 MG capsule Commonly known as: Microzide Take 1 capsule (12.5 mg total) by mouth daily.   METFORMIN HCL ER (MOD) PO Take 500 mg by mouth daily.   pantoprazole 40 MG tablet Commonly known as: PROTONIX Take 40 mg by mouth daily.        Follow-up Information    Andreas Blower., MD. Schedule an appointment as soon as possible for a visit in 1 week(s).   Specialty: Internal Medicine Contact information: [address] [place] [address] [phone]              Allergies  Allergen Reactions  . Penicillins Anaphylaxis  . Losartan Swelling    Consultations:  None   Procedures/Studies: CT Angio Abdomen W and/or Wo Contrast  Result Date: 02/11/2021 CLINICAL DATA:  Suspected vascular disease involving the SMA in a patient with enteritis. EXAM: CT ANGIOGRAPHY ABDOMEN TECHNIQUE: Multidetector CT imaging of the abdomen was performed using the standard protocol during bolus administration of intravenous contrast. Multiplanar reconstructed images and MIPs were obtained and reviewed to evaluate the vascular anatomy. CONTRAST:  75mL OMNIPAQUE IOHEXOL 350 MG/ML SOLN COMPARISON:  Exam from earlier on February 11, 2021. FINDINGS: VASCULAR Aorta: The aorta is of normal caliber with calcified atheromatous plaque. No signs of dissection or vasculitis. Celiac: Celiac with mild disease at the origin. Widely patent with classic celiac anatomy. No signs of aneurysmal dilation of the celiac or its branches. SMA: SMA with narrowing approximately 4 cm from the origin due to atherosclerotic plaque, mild to moderate narrowing. Vessel beyond this plaque is well opacified to peripheral branches in the abdomen. No adjacent stranding or dilation. Plaque is eccentric in the lumen along the LEFT lateral aspect of the lumen. Renals: 2 RIGHT renal arteries. Two LEFT renal arteries. Vessels are patent without signs of dilation, beading or stenosis. IMA: Patent without evidence of aneurysm, dissection, vasculitis or significant stenosis. Inflow: Patent without evidence of aneurysm, dissection, vasculitis or significant stenosis. Veins: Veins better assessed on earlier venous phase evaluation, normal caliber and smooth contour as before.  Review of the MIP images confirms the above findings. NON-VASCULAR Lower chest: Unremarkable Hepatobiliary: Normal appearance of the liver. No pericholecystic stranding. Pancreas: Normal, without mass, inflammation or ductal dilatation. Spleen: Normal spleen. Adrenals/Urinary Tract: Adrenal glands are normal. Symmetric renal enhancement without hydronephrosis. Renal sinus cysts bilaterally and small LEFT renal cyst as before. Stomach/Bowel: Lap band in place as before. Signs of segmental enteritis of the jejunum. Bowel is incompletely imaged. Findings are similar with respect to visualized portions of the bowel compared to the recent evaluation. Lymphatic: There is no gastrohepatic or hepatoduodenal ligament lymphadenopathy. No retroperitoneal or mesenteric lymphadenopathy. Other: No free air.  No portal venous gas.  No ascites. Musculoskeletal: No acute bone finding or destructive bone process. IMPRESSION: 1. 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 Vessel is well opacified to peripheral branches in the abdomen. No adjacent stranding or dilation. 2. Aortic atherosclerosis. 3. Signs of segmental enteritis of the jejunum. Bowel is incompletely imaged. Findings are similar with respect to visualized portions of the bowel compared to the recent evaluation. Again would suggest lactate correlation and consider the possibility of angioedema. 4. Lap band in place as before. 5. Renal sinus cysts bilaterally  and small LEFT renal cyst as before. Aortic Atherosclerosis (ICD10-I70.0). Electronically Signed   By: Donzetta Kohut M.D.   On: 02/11/2021 15:42   CT ABDOMEN PELVIS W CONTRAST  Result Date: 02/11/2021 CLINICAL DATA:  Abdominal pain, post lap band. EXAM: CT ABDOMEN AND PELVIS WITH CONTRAST TECHNIQUE: Multidetector CT imaging of the abdomen and pelvis was performed using the standard  protocol following bolus administration of intravenous contrast. CONTRAST:  <See Chart> OMNIPAQUE IOHEXOL 300 MG/ML  SOLN COMPARISON:  May 21, 2016.  Recent upper GI. FINDINGS: Lower chest: Incidental imaging of the lung bases is unremarkable. Distal esophagus mildly thickened as on previous imaging. Hepatobiliary: Liver with normal appearance. The no pericholecystic stranding or sign of biliary duct dilation. Pancreas: Normal, without mass, inflammation or ductal dilatation. Spleen: Spleen normal size and contour. Adrenals/Urinary Tract: Adrenal glands are normal. Symmetric renal enhancement. No hydronephrosis. Renal sinus cysts on the LEFT. Small cortical cyst at the interpolar aspect of the LEFT kidney. Renal sinus cyst also present on the RIGHT. No hydronephrosis. Normal under distended appearance of the urinary bladder. Stomach/Bowel: Post lap band as on prior studies. Angle of the lap band is unchanged compared to the previous evaluation angle at approximately 43 degrees. Moderately distended stomach. Segmental bowel wall thickening involving the jejunum with mild mesenteric inflammation/edema with gradual transition as jejunum passes into the ileum. No sign of pneumatosis. Fluid-filled loops of bowel with ingested contents in the lumen. Colon without signs of adjacent stranding. Scattered colonic diverticulosis. Vascular/Lymphatic: Aortic atherosclerosis. Atherosclerosis also affecting visceral branches. Approximately 4 cm beyond the origin of the SMA is calcified plaque which also appears to be associated with soft plaque with what appears to be at least moderate narrowing of the vessel which cannot be well quantified given motion related artifact. Vessel beyond this is grossly patent on venous phase. No aneurysmal dilation of the abdominal aorta. SMV is patent. Portal vein is patent. There is no gastrohepatic or hepatoduodenal ligament lymphadenopathy. No retroperitoneal or mesenteric lymphadenopathy.  No pelvic sidewall lymphadenopathy. Reproductive: Fiducial markers about the prostate, radiopaque metallic markers potentially related to prior prostate radiation though of uncertain significance, should be correlated with patient history. Other: Fat containing bilateral inguinal hernias. No free air. No ascites. Musculoskeletal: No acute bone finding or destructive bone process. IMPRESSION: 1. Findings of segmental enteritis involving the jejunum. Nonspecific but given angiotensin receptor blocker therapy with losartan would consider the possibility of angioedema of the small bowel. Correlate with intermittent abdominal pain, would also correlate with lactate to exclude the possibility of ischemia given atherosclerotic changes, see below. 2. SMA narrowing described above difficult to quantify given some motion and venous phase assessment. In the setting of enteritis CT angiography may be helpful for further assessment. 3. Lap band without complicating features. Distal esophageal thickening likely esophagitis. 4. Fiducial markers about the prostate gland. Correlate with any history of prostate neoplasm and prior radiation. 5. Fat containing bilateral inguinal hernias. 6. Aortic atherosclerosis. These results were called by telephone at the time of interpretation on 02/11/2021 at 1426 pm to provider [facility] , who verbally acknowledged these results. Electronically Signed   By: Donzetta Kohut M.D.   On: 02/11/2021 14:26   DG UGI W SINGLE CM (SOL OR THIN BA)  Result Date: 02/05/2021 CLINICAL DATA:  Status post lap band. EXAM: UPPER GI SERIES WITH KUB TECHNIQUE: After obtaining a scout radiograph a routine upper GI series was performed using thin and high density barium. FLUOROSCOPY TIME:  Fluoroscopy Time:  2  minutes and 48 seconds. Radiation Exposure Index (if provided by the fluoroscopic device): 62 mGy Number of Acquired Spot Images: COMPARISON:  None. FINDINGS: Pre-procedure KUB shows lap band in the  medial left upper quadrant oriented from 2:00 to 8:00 position. The bowel gas pattern is Frontal and lateral views of the hypopharynx while swallowing thick barium are normal. Single contrast imaging of the esophagus shows no evidence for stricture, mass lesion, diverticulum, or gross mucosal ulceration. Barium flows freely through the lap band into the stomach. Stomach is normally oriented with prompt gastric emptying. Duodenal C-loop and ligament of Treitz are normally position. Assessment of motility reveals disruption of primary peristalsis on all swallows, consistent with nonspecific esophageal motility disorder. IMPRESSION: 1. Nonspecific esophageal motility disorder. Otherwise unremarkable study in this patient status post lap band surgery. Electronically Signed   By: Kennith Center M.D.   On: 02/05/2021 08:36   ECHOCARDIOGRAM COMPLETE  Result Date: 02/12/2021    ECHOCARDIOGRAM REPORT   Patient Name:   CHANCEY RINGEL Date of Exam: 02/12/2021 Medical Rec #:  [id number]        Height:       69.0 in Accession #:    [id number]       Weight:       202.4 lb Date of Birth:  05-13-1951       BSA:          2.076 m Patient Age:    69 years         BP:           153/59 mmHg Patient Gender: M                HR:           40 bpm. Exam Location:  Inpatient Procedure: 2D Echo, Cardiac Doppler and Color Doppler Indications:    Abnormal EKG  History:        Patient has no prior history of Echocardiogram examinations.                 Risk Factors:Hypertension and Diabetes.  Sonographer:    Shirlean Kelly Referring Phys: [id number] Etienne Millward IMPRESSIONS  1. Left ventricular ejection fraction, by estimation, is 60 to 65%. The left ventricle has normal function. The left ventricle has no regional wall motion abnormalities. There is mild left ventricular hypertrophy. Left ventricular diastolic parameters were normal.  2. Right ventricular systolic function is normal. The right ventricular size is normal.  3. Left atrial  size was mildly dilated.  4. The mitral valve is normal in structure. No evidence of mitral valve regurgitation. No evidence of mitral stenosis.  5. The aortic valve is grossly normal. Aortic valve regurgitation is trivial. Mild aortic valve sclerosis is present, with no evidence of aortic valve stenosis.  6. The inferior vena cava is normal in size with greater than 50% respiratory variability, suggesting right atrial pressure of 3 mmHg. FINDINGS  Left Ventricle: Left ventricular ejection fraction, by estimation, is 60 to 65%. The left ventricle has normal function. The left ventricle has no regional wall motion abnormalities. The left ventricular internal cavity size was normal in size. There is  mild left ventricular hypertrophy. Left ventricular diastolic parameters were normal. Right Ventricle: The right ventricular size is normal. No increase in right ventricular wall thickness. Right ventricular systolic function is normal. Left Atrium: Left atrial size was mildly dilated. Right Atrium: Right atrial size was normal in size. Pericardium: There is no evidence of pericardial  effusion. Mitral Valve: The mitral valve is normal in structure. No evidence of mitral valve regurgitation. No evidence of mitral valve stenosis. Tricuspid Valve: The tricuspid valve is normal in structure. Tricuspid valve regurgitation is not demonstrated. No evidence of tricuspid stenosis. Aortic Valve: The aortic valve is grossly normal. Aortic valve regurgitation is trivial. Mild aortic valve sclerosis is present, with no evidence of aortic valve stenosis. Aortic valve mean gradient measures 5.0 mmHg. Aortic valve peak gradient measures 10.0 mmHg. Aortic valve area, by VTI measures 2.50 cm. Pulmonic Valve: The pulmonic valve was normal in structure. Pulmonic valve regurgitation is not visualized. No evidence of pulmonic stenosis. Aorta: The aortic root is normal in size and structure. Venous: The inferior vena cava is normal in size  with greater than 50% respiratory variability, suggesting right atrial pressure of 3 mmHg. IAS/Shunts: No atrial level shunt detected by color flow Doppler.  LEFT VENTRICLE PLAX 2D LVIDd:         5.10 cm  Diastology LVIDs:         3.20 cm  LV e' medial:    7.72 cm/s LV PW:         1.20 cm  LV E/e' medial:  12.8 LV IVS:        1.00 cm  LV e' lateral:   11.20 cm/s LVOT diam:     2.00 cm  LV E/e' lateral: 8.8 LV SV:         90 LV SV Index:   43 LVOT Area:     3.14 cm  RIGHT VENTRICLE             IVC RV Basal diam:  4.70 cm     IVC diam: 1.80 cm RV S prime:     13.60 cm/s TAPSE (M-mode): 2.6 cm LEFT ATRIUM             Index       RIGHT ATRIUM           Index LA diam:        4.60 cm 2.22 cm/m  RA Area:     12.50 cm LA Vol (A2C):   86.2 ml 41.51 ml/m RA Volume:   27.00 ml  13.00 ml/m LA Vol (A4C):   82.3 ml 39.64 ml/m LA Biplane Vol: 87.0 ml 41.90 ml/m  AORTIC VALVE AV Area (Vmax):    2.53 cm AV Area (Vmean):   2.36 cm AV Area (VTI):     2.50 cm AV Vmax:           158.00 cm/s AV Vmean:          102.000 cm/s AV VTI:            0.360 m AV Peak Grad:      10.0 mmHg AV Mean Grad:      5.0 mmHg LVOT Vmax:         127.00 cm/s LVOT Vmean:        76.600 cm/s LVOT VTI:          0.287 m LVOT/AV VTI ratio: 0.80  AORTA Ao Root diam: 3.00 cm Ao Asc diam:  3.40 cm MITRAL VALVE MV Area (PHT): 3.91 cm    SHUNTS MV Decel Time: 194 msec    Systemic VTI:  0.29 m MV E velocity: 99.00 cm/s  Systemic Diam: 2.00 cm MV A velocity: 84.40 cm/s MV E/A ratio:  1.17 Donato Schultz MD Electronically signed by Donato Schultz MD Signature Date/Time: 02/12/2021/2:39:36 PM  Final       Subjective:  Patient seen and examined the bedside this morning.  Hemodynamically stable for discharge today.  Discharge Exam: Vitals:   02/12/21 1308 02/12/21 1400  BP: (!) 141/65 (!) 155/51  Pulse: (!) 44 (!) 42  Resp: 12 12  Temp:    SpO2: 97% 96%   Vitals:   02/12/21 1000 02/12/21 1130 02/12/21 1308 02/12/21 1400  BP: (!) 146/55  (!) 141/65 (!)  155/51  Pulse: (!) 40  (!) 44 (!) 42  Resp: 16  12 12   Temp:  98.2 F (36.8 C)    TempSrc:  Oral    SpO2: 95%  97% 96%  Weight:      Height:        General: Pt is alert, awake, not in acute distress Cardiovascular: RRR, S1/S2 +, no rubs, no gallops Respiratory: CTA bilaterally, no wheezing, no rhonchi Abdominal: Soft, NT, ND, bowel sounds + Extremities: no edema, no cyanosis    The results of significant diagnostics from this hospitalization (including imaging, microbiology, ancillary and laboratory) are listed below for reference.     Microbiology: Recent Results (from the past 240 hour(s))  Resp Panel by RT-PCR (Flu A&B, Covid) Nasopharyngeal Swab     Status: None   Collection Time: 02/11/21  4:45 PM   Specimen: Nasopharyngeal Swab; Nasopharyngeal(NP) swabs in vial transport medium  Result Value Ref Range Status   SARS Coronavirus 2 by RT PCR NEGATIVE NEGATIVE Final    Comment: (NOTE) SARS-CoV-2 target nucleic acids are NOT DETECTED.  The SARS-CoV-2 RNA is generally detectable in upper respiratory specimens during the acute phase of infection. The lowest concentration of SARS-CoV-2 viral copies this assay can detect is 138 copies/mL. A negative result does not preclude SARS-Cov-2 infection and should not be used as the sole basis for treatment or other patient management decisions. A negative result may occur with  improper specimen collection/handling, submission of specimen other than nasopharyngeal swab, presence of viral mutation(s) within the areas targeted by this assay, and inadequate number of viral copies(<138 copies/mL). A negative result must be combined with clinical observations, patient history, and epidemiological information. The expected result is Negative.  Fact Sheet for Patients:  BloggerCourse.comhttps://www.fda.gov/media/152166/download  Fact Sheet for Healthcare Providers:  SeriousBroker.ithttps://www.fda.gov/media/152162/download  This test is no t yet approved or cleared  by the [place] FDA and  has been authorized for detection and/or diagnosis of SARS-CoV-2 by FDA under an Emergency Use Authorization (EUA). This EUA will remain  in effect (meaning this test can be used) for the duration of the COVID-19 declaration under Section 564(b)(1) of the Act, 21 U.S.C.section 360bbb-3(b)(1), unless the authorization is terminated  or revoked sooner.       Influenza A by PCR NEGATIVE NEGATIVE Final   Influenza B by PCR NEGATIVE NEGATIVE Final    Comment: (NOTE) The Xpert Xpress SARS-CoV-2/FLU/RSV plus assay is intended as an aid in the diagnosis of influenza from Nasopharyngeal swab specimens and should not be used as a sole basis for treatment. Nasal washings and aspirates are unacceptable for Xpert Xpress SARS-CoV-2/FLU/RSV testing.  Fact Sheet for Patients: BloggerCourse.comhttps://www.fda.gov/media/152166/download  Fact Sheet for Healthcare Providers: SeriousBroker.ithttps://www.fda.gov/media/152162/download  This test is not yet approved or cleared by the [place] FDA and has been authorized for detection and/or diagnosis of SARS-CoV-2 by FDA under an Emergency Use Authorization (EUA). This EUA will remain in effect (meaning this test can be used) for the duration of the COVID-19 declaration under Section 564(b)(1) of the  Act, 21 U.S.C. section 360bbb-3(b)(1), unless the authorization is terminated or revoked.  Performed at [facility], [address] [place], [place] [address]   MRSA PCR Screening     Status: None   Collection Time: 02/12/21  4:40 AM   Specimen: Nasopharyngeal  Result Value Ref Range Status   MRSA by PCR NEGATIVE NEGATIVE Final    Comment:        The GeneXpert MRSA Assay (FDA approved for NASAL specimens only), is one component of a comprehensive MRSA colonization surveillance program. It is not intended to diagnose MRSA infection nor to guide or monitor treatment for MRSA infections. Performed at [facility],  [address], [place], [place] [address]      Labs: BNP (last 3 results) No results for input(s): BNP in the last 8760 hours. Basic Metabolic Panel: Recent Labs  Lab 02/11/21 1225 02/12/21 0259  NA 134* 139  K 3.8 3.9  CL 99 105  CO2 28 29  GLUCOSE 166* 127*  BUN 13 11  CREATININE 1.03 1.11  CALCIUM 8.9 8.5*   Liver Function Tests: Recent Labs  Lab 02/11/21 1225 02/12/21 0259  AST 22 15  ALT 18 14  ALKPHOS 61 52  BILITOT 0.7 0.9  PROT 7.5 5.9*  ALBUMIN 3.8 3.0*   Recent Labs  Lab 02/11/21 1225  LIPASE 25   No results for input(s): AMMONIA in the last 168 hours. CBC: Recent Labs  Lab 02/11/21 1225 02/12/21 0259  WBC 10.7* 8.0  HGB 14.0 12.1*  HCT 41.5 37.3*  MCV 85.4 88.8  PLT 323 276   Cardiac Enzymes: No results for input(s): CKTOTAL, CKMB, CKMBINDEX, TROPONINI in the last 168 hours. BNP: Invalid input(s): POCBNP CBG: Recent Labs  Lab 02/11/21 2346 02/12/21 0334 02/12/21 0754 02/12/21 1212  GLUCAP 127* 118* 130* 107*   D-Dimer No results for input(s): DDIMER in the last 72 hours. Hgb A1c No results for input(s): HGBA1C in the last 72 hours. Lipid Profile No results for input(s): CHOL, HDL, LDLCALC, TRIG, CHOLHDL, LDLDIRECT in the last 72 hours. Thyroid function studies No results for input(s): TSH, T4TOTAL, T3FREE, THYROIDAB in the last 72 hours.  Invalid input(s): FREET3 Anemia work up No results for input(s): VITAMINB12, FOLATE, FERRITIN, TIBC, IRON, RETICCTPCT in the last 72 hours. Urinalysis    Component Value Date/Time   COLORURINE YELLOW 02/11/2021 1225   APPEARANCEUR CLEAR 02/11/2021 1225   LABSPEC 1.020 02/11/2021 1225   PHURINE 7.0 02/11/2021 1225   GLUCOSEU NEGATIVE 02/11/2021 1225   HGBUR TRACE (A) 02/11/2021 1225   BILIRUBINUR NEGATIVE 02/11/2021 1225   KETONESUR NEGATIVE 02/11/2021 1225   PROTEINUR NEGATIVE 02/11/2021 1225   NITRITE NEGATIVE 02/11/2021 1225   LEUKOCYTESUR NEGATIVE 02/11/2021 1225   Sepsis  Labs Invalid input(s): PROCALCITONIN,  WBC,  LACTICIDVEN Microbiology Recent Results (from the past 240 hour(s))  Resp Panel by RT-PCR (Flu A&B, Covid) Nasopharyngeal Swab     Status: None   Collection Time: 02/11/21  4:45 PM   Specimen: Nasopharyngeal Swab; Nasopharyngeal(NP) swabs in vial transport medium  Result Value Ref Range Status   SARS Coronavirus 2 by RT PCR NEGATIVE NEGATIVE Final    Comment: (NOTE) SARS-CoV-2 target nucleic acids are NOT DETECTED.  The SARS-CoV-2 RNA is generally detectable in upper respiratory specimens during the acute phase of infection. The lowest concentration of SARS-CoV-2 viral copies this assay can detect is 138 copies/mL. A negative result does not preclude SARS-Cov-2 infection and should not be used  as the sole basis for treatment or other patient management decisions. A negative result may occur with  improper specimen collection/handling, submission of specimen other than nasopharyngeal swab, presence of viral mutation(s) within the areas targeted by this assay, and inadequate number of viral copies(<138 copies/mL). A negative result must be combined with clinical observations, patient history, and epidemiological information. The expected result is Negative.  Fact Sheet for Patients:  BloggerCourse.com  Fact Sheet for Healthcare Providers:  SeriousBroker.it  This test is no t yet approved or cleared by the [place] FDA and  has been authorized for detection and/or diagnosis of SARS-CoV-2 by FDA under an Emergency Use Authorization (EUA). This EUA will remain  in effect (meaning this test can be used) for the duration of the COVID-19 declaration under Section 564(b)(1) of the Act, 21 U.S.C.section 360bbb-3(b)(1), unless the authorization is terminated  or revoked sooner.       Influenza A by PCR NEGATIVE NEGATIVE Final   Influenza B by PCR NEGATIVE NEGATIVE Final    Comment:  (NOTE) The Xpert Xpress SARS-CoV-2/FLU/RSV plus assay is intended as an aid in the diagnosis of influenza from Nasopharyngeal swab specimens and should not be used as a sole basis for treatment. Nasal washings and aspirates are unacceptable for Xpert Xpress SARS-CoV-2/FLU/RSV testing.  Fact Sheet for Patients: BloggerCourse.com  Fact Sheet for Healthcare Providers: SeriousBroker.it  This test is not yet approved or cleared by the [place] FDA and has been authorized for detection and/or diagnosis of SARS-CoV-2 by FDA under an Emergency Use Authorization (EUA). This EUA will remain in effect (meaning this test can be used) for the duration of the COVID-19 declaration under Section 564(b)(1) of the Act, 21 U.S.C. section 360bbb-3(b)(1), unless the authorization is terminated or revoked.  Performed at [facility], [address] [place], [place] [address]   MRSA PCR Screening     Status: None   Collection Time: 02/12/21  4:40 AM   Specimen: Nasopharyngeal  Result Value Ref Range Status   MRSA by PCR NEGATIVE NEGATIVE Final    Comment:        The GeneXpert MRSA Assay (FDA approved for NASAL specimens only), is one component of a comprehensive MRSA colonization surveillance program. It is not intended to diagnose MRSA infection nor to guide or monitor treatment for MRSA infections. Performed at [facility], [address], [place], [place] [address]     Please note: You were cared for by a hospitalist during your hospital stay. Once you are discharged, your primary care physician will handle any further medical issues. Please note that NO REFILLS for any discharge medications will be authorized once you are discharged, as it is imperative that you return to your primary care physician (or establish a relationship with a primary care physician if you do not have one) for your post hospital  discharge needs so that they can reassess your need for medications and monitor your lab values.    Time coordinating discharge: 40 minutes  SIGNED:   Burnadette Pop, MD  [facility] 02/12/2021, 2:50 PM Pager [phone]  If 7PM-7AM, please contact night-coverage [email or web address] Password [email or web address]

## 2021-02-12 NOTE — Progress Notes (Signed)
Pt discharged. PIV removed. AVS went over patient. He stated he had no further questions. Belongings were returned and he was wheeled down to the front by this RN.

## 2021-07-22 IMAGING — CT CT ABD-PELV W/ CM
2 of 5 series · 14 of 46 positions shown, 16 images · IV contrast (omnipaque)
Comparison: May 21, 2016.  Recent upper GI.

CLINICAL DATA: Abdominal pain, post lap band.

EXAM:
CT ABDOMEN AND PELVIS WITH CONTRAST
TECHNIQUE: Multidetector CT imaging of the abdomen and pelvis was performed
using the standard protocol following bolus administration of
intravenous contrast.
CONTRAST:  <See Chart> OMNIPAQUE IOHEXOL 300 MG/ML  SOLN

[Series 2: axial st · axial · 0.98mm/px · z∈[-568,+2]mm · 11 of 126 slices shown, 13 images]
[im 6/126  soft-tissue]
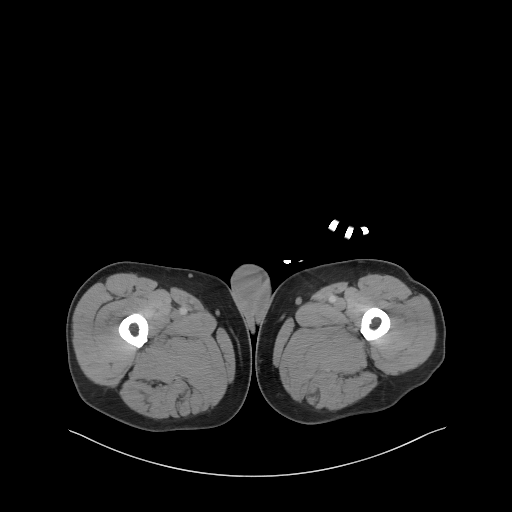
[im 6/126  bone]
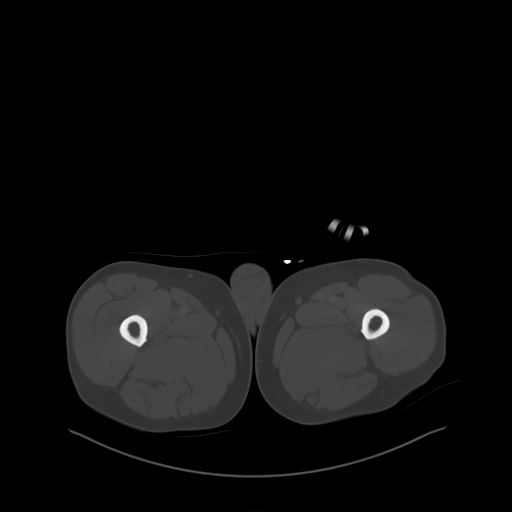
[im 18/126  soft-tissue]
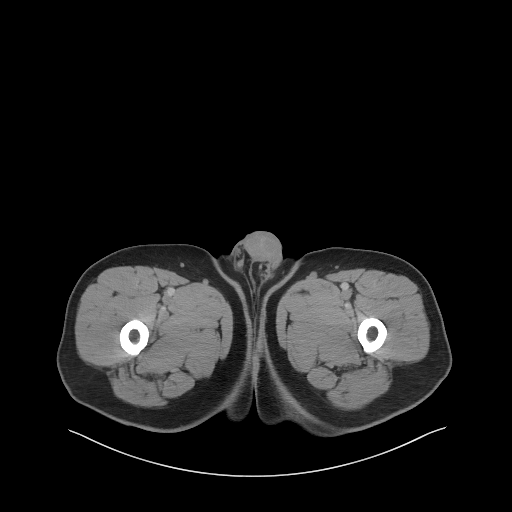
[im 30/126  soft-tissue]
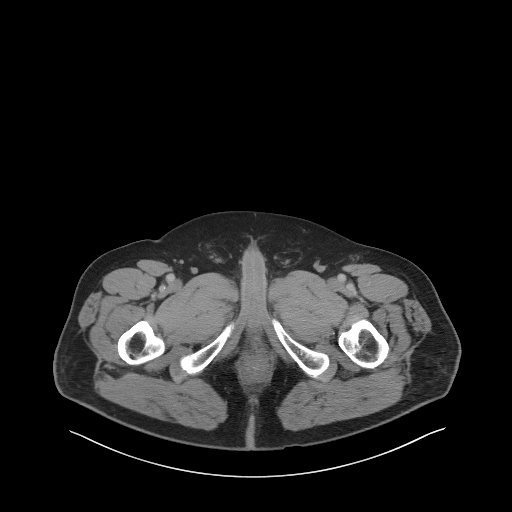
[im 42/126  soft-tissue]
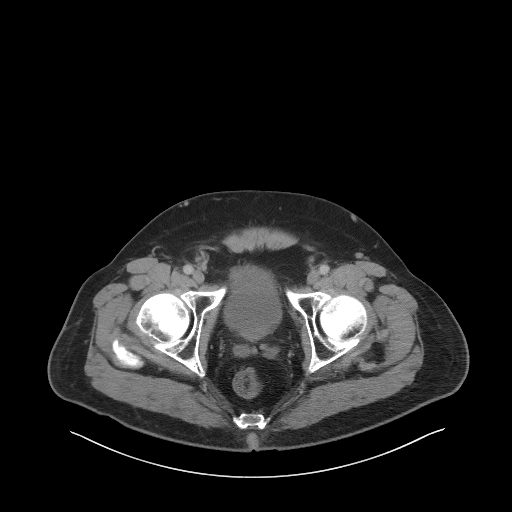
[im 54/126  soft-tissue]
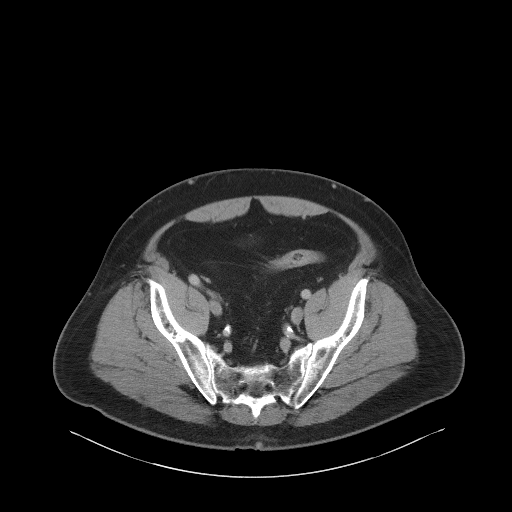
[im 66/126  soft-tissue]
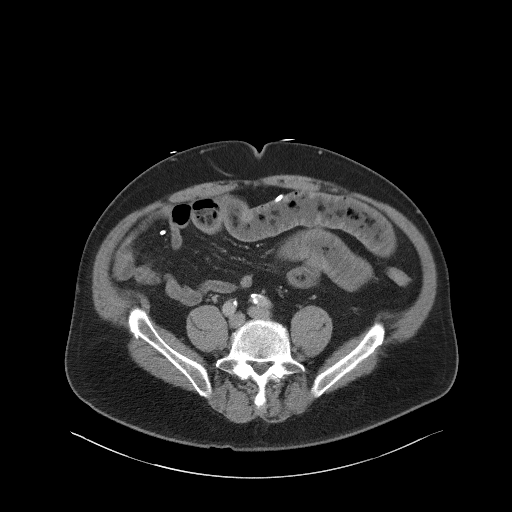
[im 72/126  soft-tissue]
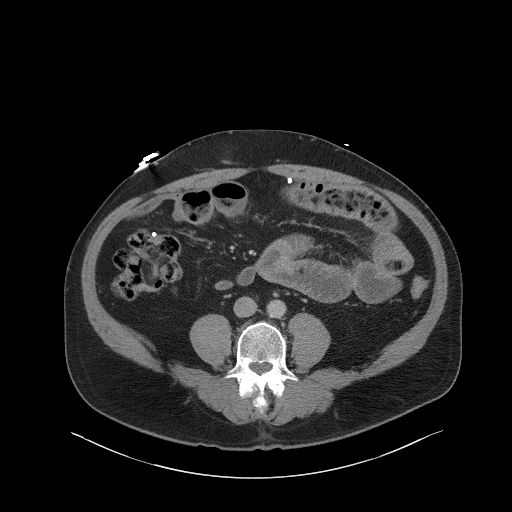
[im 84/126  soft-tissue]
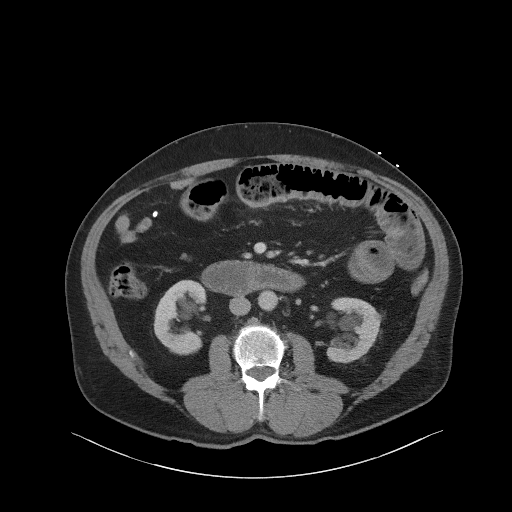
[im 96/126  soft-tissue]
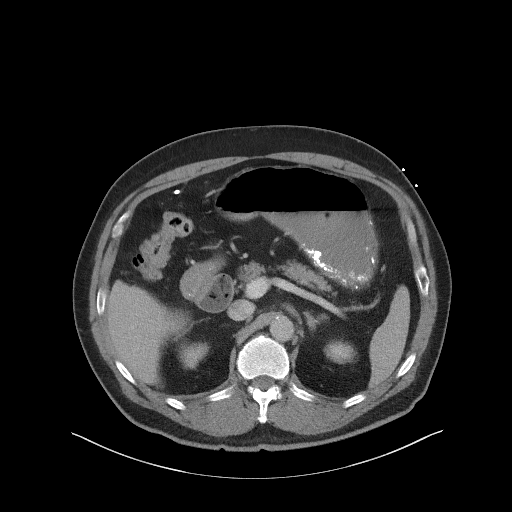
[im 96/126  bone]
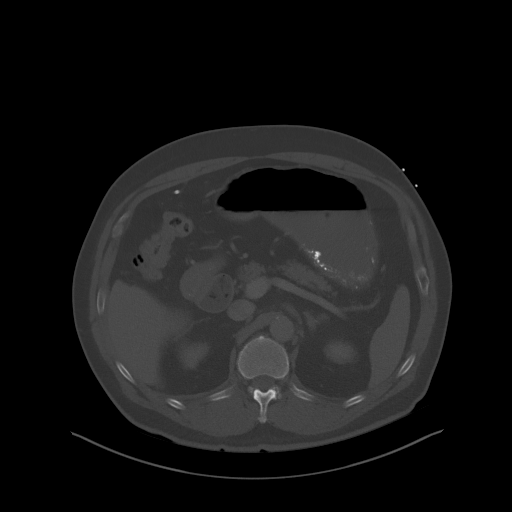
[im 108/126  soft-tissue]
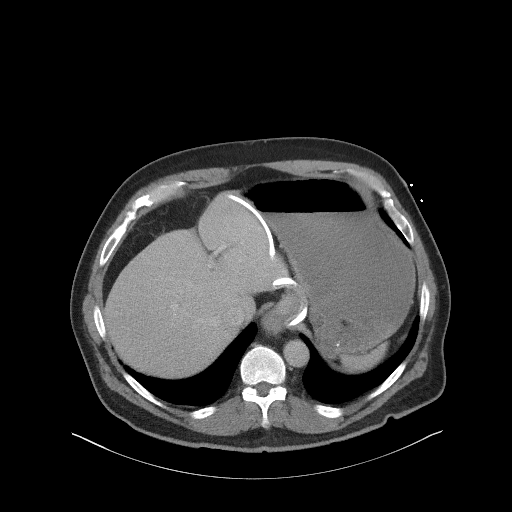
[im 120/126  soft-tissue]
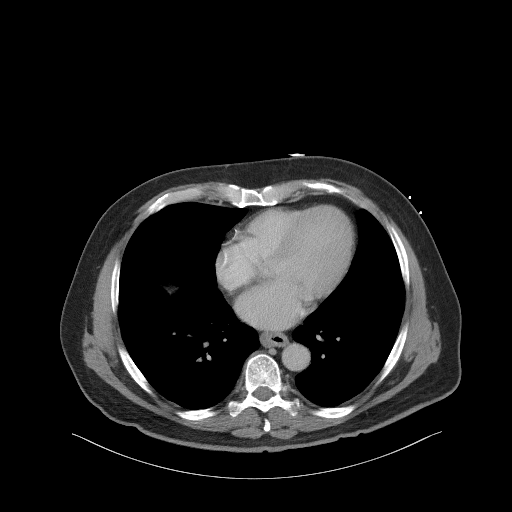

[Series 5: coronal st · coronal · 0.84mm/px · 3 of 108 slices shown]
[im 36/108  soft-tissue]
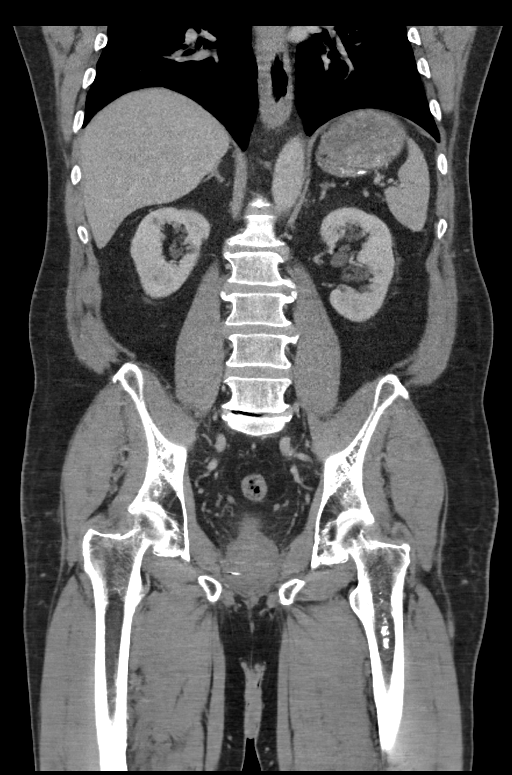
[im 48/108  soft-tissue]
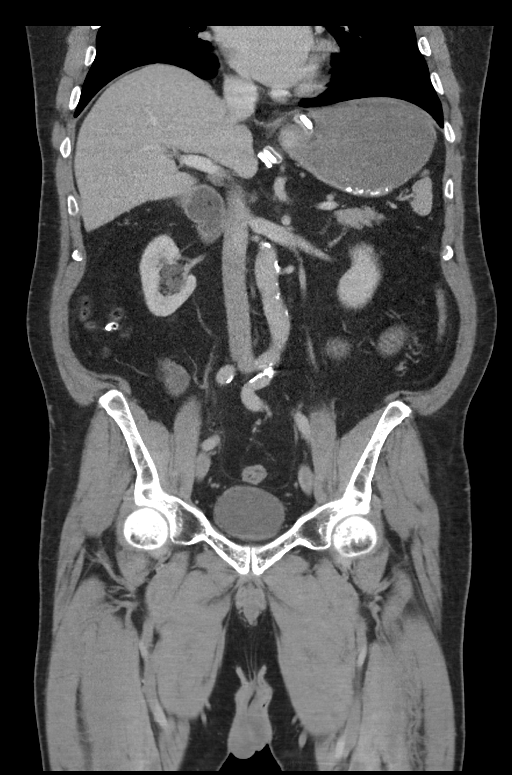
[im 60/108  soft-tissue]
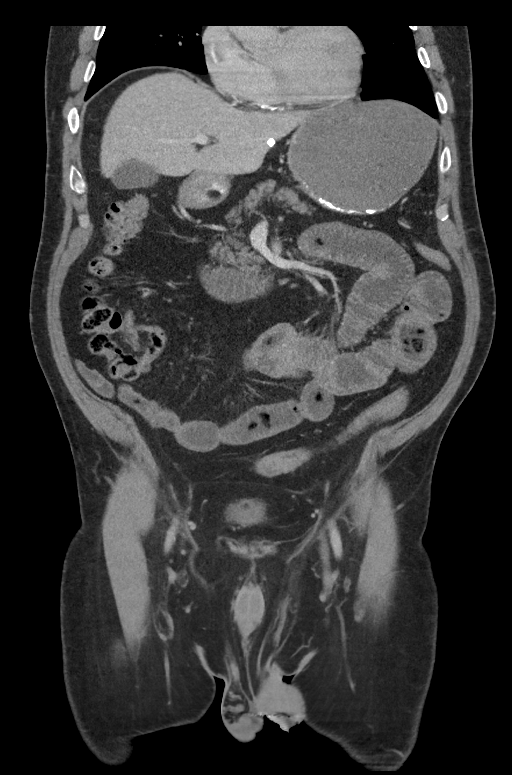

[14 of 46 positions shown; findings below may reference images not displayed]

FINDINGS: Lower chest: Incidental imaging of the lung bases is unremarkable.
Distal esophagus mildly thickened as on previous imaging.

Hepatobiliary: Liver with normal appearance. The no pericholecystic
stranding or sign of biliary duct dilation.

Pancreas: Normal, without mass, inflammation or ductal dilatation.

Spleen: Spleen normal size and contour.

Adrenals/Urinary Tract: Adrenal glands are normal.

Symmetric renal enhancement. No hydronephrosis. Renal sinus cysts on
the LEFT. Small cortical cyst at the interpolar aspect of the LEFT
kidney. Renal sinus cyst also present on the RIGHT. No
hydronephrosis. Normal under distended appearance of the urinary
bladder.

Stomach/Bowel: Post lap band as on prior studies. Angle of the lap
band is unchanged compared to the previous evaluation angle at
approximately 43 degrees. Moderately distended stomach. Segmental
bowel wall thickening involving the jejunum with mild mesenteric
inflammation/edema with gradual transition as jejunum passes into
the ileum. No sign of pneumatosis. Fluid-filled loops of bowel with
ingested contents in the lumen. Colon without signs of adjacent
stranding. Scattered colonic diverticulosis.

Vascular/Lymphatic: Aortic atherosclerosis. Atherosclerosis also
affecting visceral branches. Approximately 4 cm beyond the origin of
the SMA is calcified plaque which also appears to be associated with
soft plaque with what appears to be at least moderate narrowing of
the vessel which cannot be well quantified given motion related
artifact. Vessel beyond this is grossly patent on venous phase. No
aneurysmal dilation of the abdominal aorta. SMV is patent. Portal
vein is patent. There is no gastrohepatic or hepatoduodenal ligament
lymphadenopathy. No retroperitoneal or mesenteric lymphadenopathy.

No pelvic sidewall lymphadenopathy.

Reproductive: Fiducial markers about the prostate, radiopaque
metallic markers potentially related to prior prostate radiation
though of uncertain significance, should be correlated with patient
history.

Other: Fat containing bilateral inguinal hernias. No free air. No
ascites.

Musculoskeletal: No acute bone finding or destructive bone process.
IMPRESSION: 1. Findings of segmental enteritis involving the jejunum.
Nonspecific but given angiotensin receptor blocker therapy with
losartan would consider the possibility of angioedema of the small
bowel. Correlate with intermittent abdominal pain, would also
correlate with lactate to exclude the possibility of ischemia given
atherosclerotic changes, see below.
2. SMA narrowing described above difficult to quantify given some
motion and venous phase assessment. In the setting of enteritis CT
angiography may be helpful for further assessment.
3. Lap band without complicating features. Distal esophageal
thickening likely esophagitis.
4. Fiducial markers about the prostate gland. Correlate with any
history of prostate neoplasm and prior radiation.
5. Fat containing bilateral inguinal hernias.
6. Aortic atherosclerosis.

These results were called by telephone at the time of interpretation
on 02/11/2021 at 0744 pm to provider JULITA CANE , who
verbally acknowledged these results.
